# Patient Record
Sex: Female | Born: 1935 | Race: White | Hispanic: No | Marital: Married | Smoking: Never smoker
Health system: Southern US, Community
[De-identification: ages and names within clinical notes are randomized; demographics above are authoritative.]

## PROBLEM LIST (undated history)

## (undated) DIAGNOSIS — G4700 Insomnia, unspecified: Secondary | ICD-10-CM

## (undated) DIAGNOSIS — M8580 Other specified disorders of bone density and structure, unspecified site: Secondary | ICD-10-CM

## (undated) DIAGNOSIS — G2581 Restless legs syndrome: Secondary | ICD-10-CM

## (undated) DIAGNOSIS — Z8679 Personal history of other diseases of the circulatory system: Secondary | ICD-10-CM

## (undated) DIAGNOSIS — K5792 Diverticulitis of intestine, part unspecified, without perforation or abscess without bleeding: Secondary | ICD-10-CM

## (undated) DIAGNOSIS — Z8489 Family history of other specified conditions: Secondary | ICD-10-CM

## (undated) DIAGNOSIS — M1990 Unspecified osteoarthritis, unspecified site: Secondary | ICD-10-CM

## (undated) DIAGNOSIS — K219 Gastro-esophageal reflux disease without esophagitis: Secondary | ICD-10-CM

## (undated) DIAGNOSIS — E039 Hypothyroidism, unspecified: Secondary | ICD-10-CM

## (undated) DIAGNOSIS — C539 Malignant neoplasm of cervix uteri, unspecified: Secondary | ICD-10-CM

## (undated) DIAGNOSIS — R634 Abnormal weight loss: Secondary | ICD-10-CM

## (undated) DIAGNOSIS — K921 Melena: Secondary | ICD-10-CM

## (undated) DIAGNOSIS — K644 Residual hemorrhoidal skin tags: Secondary | ICD-10-CM

## (undated) DIAGNOSIS — E785 Hyperlipidemia, unspecified: Secondary | ICD-10-CM

## (undated) DIAGNOSIS — G629 Polyneuropathy, unspecified: Secondary | ICD-10-CM

## (undated) DIAGNOSIS — N342 Other urethritis: Secondary | ICD-10-CM

## (undated) DIAGNOSIS — K5790 Diverticulosis of intestine, part unspecified, without perforation or abscess without bleeding: Secondary | ICD-10-CM

## (undated) DIAGNOSIS — E079 Disorder of thyroid, unspecified: Secondary | ICD-10-CM

## (undated) DIAGNOSIS — F32A Depression, unspecified: Secondary | ICD-10-CM

## (undated) DIAGNOSIS — R1310 Dysphagia, unspecified: Secondary | ICD-10-CM

## (undated) DIAGNOSIS — D649 Anemia, unspecified: Secondary | ICD-10-CM

## (undated) DIAGNOSIS — K449 Diaphragmatic hernia without obstruction or gangrene: Secondary | ICD-10-CM

## (undated) DIAGNOSIS — F419 Anxiety disorder, unspecified: Secondary | ICD-10-CM

## (undated) DIAGNOSIS — F329 Major depressive disorder, single episode, unspecified: Secondary | ICD-10-CM

## (undated) HISTORY — DX: Major depressive disorder, single episode, unspecified: F32.9

## (undated) HISTORY — DX: Family history of other specified conditions: Z84.89

## (undated) HISTORY — PX: ERCP: SHX60

## (undated) HISTORY — DX: Restless legs syndrome: G25.81

## (undated) HISTORY — DX: Melena: K92.1

## (undated) HISTORY — PX: CATARACT EXTRACTION: SUR2

## (undated) HISTORY — DX: Abnormal weight loss: R63.4

## (undated) HISTORY — PX: BREAST LUMPECTOMY: SHX2

## (undated) HISTORY — DX: Depression, unspecified: F32.A

## (undated) HISTORY — DX: Other urethritis: N34.2

## (undated) HISTORY — DX: Diverticulitis of intestine, part unspecified, without perforation or abscess without bleeding: K57.92

## (undated) HISTORY — PX: HEMORRHOID SURGERY: SHX153

## (undated) HISTORY — DX: Disorder of thyroid, unspecified: E07.9

## (undated) HISTORY — DX: Anxiety disorder, unspecified: F41.9

## (undated) HISTORY — DX: Diverticulosis of intestine, part unspecified, without perforation or abscess without bleeding: K57.90

## (undated) HISTORY — DX: Hypothyroidism, unspecified: E03.9

## (undated) HISTORY — DX: Hyperlipidemia, unspecified: E78.5

## (undated) HISTORY — DX: Anemia, unspecified: D64.9

## (undated) HISTORY — DX: Residual hemorrhoidal skin tags: K64.4

## (undated) HISTORY — DX: Polyneuropathy, unspecified: G62.9

## (undated) HISTORY — DX: Malignant neoplasm of cervix uteri, unspecified: C53.9

## (undated) HISTORY — DX: Diaphragmatic hernia without obstruction or gangrene: K44.9

## (undated) HISTORY — DX: Personal history of other diseases of the circulatory system: Z86.79

## (undated) HISTORY — DX: Insomnia, unspecified: G47.00

## (undated) HISTORY — DX: Other specified disorders of bone density and structure, unspecified site: M85.80

## (undated) HISTORY — DX: Unspecified osteoarthritis, unspecified site: M19.90

## (undated) HISTORY — PX: HAND SURGERY: SHX662

## (undated) HISTORY — PX: COLONOSCOPY: SHX174

## (undated) HISTORY — DX: Gastro-esophageal reflux disease without esophagitis: K21.9

---

## 1958-12-16 DIAGNOSIS — C539 Malignant neoplasm of cervix uteri, unspecified: Secondary | ICD-10-CM

## 1958-12-16 HISTORY — PX: ABDOMINAL HYSTERECTOMY: SHX81

## 1958-12-16 HISTORY — DX: Malignant neoplasm of cervix uteri, unspecified: C53.9

## 1998-03-28 ENCOUNTER — Other Ambulatory Visit: Admission: RE | Admit: 1998-03-28 | Discharge: 1998-03-28 | Payer: Self-pay | Admitting: *Deleted

## 1999-02-07 ENCOUNTER — Ambulatory Visit (HOSPITAL_COMMUNITY): Admission: RE | Admit: 1999-02-07 | Discharge: 1999-02-07 | Payer: Self-pay | Admitting: Gastroenterology

## 1999-02-07 ENCOUNTER — Encounter: Payer: Self-pay | Admitting: Gastroenterology

## 1999-03-20 ENCOUNTER — Other Ambulatory Visit: Admission: RE | Admit: 1999-03-20 | Discharge: 1999-03-20 | Payer: Self-pay | Admitting: *Deleted

## 1999-04-11 ENCOUNTER — Emergency Department (HOSPITAL_COMMUNITY): Admission: EM | Admit: 1999-04-11 | Discharge: 1999-04-11 | Payer: Self-pay | Admitting: Emergency Medicine

## 2000-01-14 ENCOUNTER — Ambulatory Visit (HOSPITAL_COMMUNITY): Admission: RE | Admit: 2000-01-14 | Discharge: 2000-01-14 | Payer: Self-pay | Admitting: Gastroenterology

## 2000-01-14 ENCOUNTER — Encounter: Payer: Self-pay | Admitting: Gastroenterology

## 2000-01-22 ENCOUNTER — Inpatient Hospital Stay (HOSPITAL_COMMUNITY): Admission: EM | Admit: 2000-01-22 | Discharge: 2000-01-24 | Payer: Self-pay | Admitting: Emergency Medicine

## 2000-01-22 ENCOUNTER — Encounter: Payer: Self-pay | Admitting: Emergency Medicine

## 2000-05-30 ENCOUNTER — Other Ambulatory Visit: Admission: RE | Admit: 2000-05-30 | Discharge: 2000-05-30 | Payer: Self-pay | Admitting: *Deleted

## 2000-08-03 ENCOUNTER — Emergency Department (HOSPITAL_COMMUNITY): Admission: EM | Admit: 2000-08-03 | Discharge: 2000-08-03 | Payer: Self-pay | Admitting: Emergency Medicine

## 2000-08-03 ENCOUNTER — Encounter: Payer: Self-pay | Admitting: Emergency Medicine

## 2001-09-01 ENCOUNTER — Other Ambulatory Visit: Admission: RE | Admit: 2001-09-01 | Discharge: 2001-09-01 | Payer: Self-pay | Admitting: *Deleted

## 2002-05-08 ENCOUNTER — Emergency Department (HOSPITAL_COMMUNITY): Admission: EM | Admit: 2002-05-08 | Discharge: 2002-05-08 | Payer: Self-pay

## 2002-05-19 ENCOUNTER — Emergency Department (HOSPITAL_COMMUNITY): Admission: EM | Admit: 2002-05-19 | Discharge: 2002-05-19 | Payer: Self-pay | Admitting: Emergency Medicine

## 2002-07-05 ENCOUNTER — Encounter: Payer: Self-pay | Admitting: Gastroenterology

## 2002-07-05 ENCOUNTER — Ambulatory Visit (HOSPITAL_COMMUNITY): Admission: RE | Admit: 2002-07-05 | Discharge: 2002-07-05 | Payer: Self-pay | Admitting: Gastroenterology

## 2003-01-28 ENCOUNTER — Encounter: Payer: Self-pay | Admitting: Emergency Medicine

## 2003-01-28 ENCOUNTER — Inpatient Hospital Stay (HOSPITAL_COMMUNITY): Admission: EM | Admit: 2003-01-28 | Discharge: 2003-01-29 | Payer: Self-pay | Admitting: Emergency Medicine

## 2005-01-08 ENCOUNTER — Ambulatory Visit: Payer: Self-pay | Admitting: Family Medicine

## 2005-02-06 ENCOUNTER — Ambulatory Visit: Payer: Self-pay | Admitting: Family Medicine

## 2005-04-16 ENCOUNTER — Ambulatory Visit: Payer: Self-pay | Admitting: Family Medicine

## 2005-10-02 ENCOUNTER — Ambulatory Visit: Payer: Self-pay | Admitting: Gastroenterology

## 2005-10-16 ENCOUNTER — Ambulatory Visit: Payer: Self-pay | Admitting: Family Medicine

## 2005-12-04 ENCOUNTER — Ambulatory Visit: Payer: Self-pay | Admitting: Family Medicine

## 2005-12-19 ENCOUNTER — Ambulatory Visit: Payer: Self-pay | Admitting: Family Medicine

## 2006-03-11 ENCOUNTER — Ambulatory Visit: Payer: Self-pay | Admitting: Family Medicine

## 2006-07-10 ENCOUNTER — Ambulatory Visit: Payer: Self-pay | Admitting: Family Medicine

## 2006-08-07 ENCOUNTER — Ambulatory Visit: Payer: Self-pay | Admitting: Family Medicine

## 2006-09-18 ENCOUNTER — Ambulatory Visit: Payer: Self-pay | Admitting: Family Medicine

## 2006-11-20 ENCOUNTER — Ambulatory Visit: Payer: Self-pay | Admitting: Family Medicine

## 2007-01-27 ENCOUNTER — Ambulatory Visit: Payer: Self-pay | Admitting: Family Medicine

## 2007-05-26 ENCOUNTER — Ambulatory Visit: Payer: Self-pay | Admitting: Family Medicine

## 2007-05-26 LAB — CONVERTED CEMR LAB
ALT: 13 units/L (ref 0–40)
AST: 26 units/L (ref 0–37)
Albumin: 4 g/dL (ref 3.5–5.2)
Alkaline Phosphatase: 151 units/L — ABNORMAL HIGH (ref 39–117)
BUN: 17 mg/dL (ref 6–23)
Basophils Absolute: 0 10*3/uL (ref 0.0–0.1)
Basophils Relative: 0.6 % (ref 0.0–1.0)
Bilirubin, Direct: 0.1 mg/dL (ref 0.0–0.3)
CO2: 27 meq/L (ref 19–32)
Calcium: 9.5 mg/dL (ref 8.4–10.5)
Chloride: 103 meq/L (ref 96–112)
Cholesterol: 253 mg/dL (ref 0–200)
Creatinine, Ser: 0.9 mg/dL (ref 0.4–1.2)
Direct LDL: 153.9 mg/dL
Eosinophils Absolute: 0.1 10*3/uL (ref 0.0–0.6)
Eosinophils Relative: 1.2 % (ref 0.0–5.0)
GFR calc Af Amer: 76 mL/min
GFR calc non Af Amer: 63 mL/min
Glucose, Bld: 94 mg/dL (ref 70–99)
HCT: 40.2 % (ref 36.0–46.0)
HDL: 62.1 mg/dL (ref >39.0)
Hemoglobin: 13.9 g/dL (ref 12.0–15.0)
Lymphocytes Relative: 20.3 % (ref 12.0–46.0)
MCHC: 34.5 g/dL (ref 30.0–36.0)
MCV: 95.1 fL (ref 78.0–100.0)
Monocytes Absolute: 0.4 10*3/uL (ref 0.2–0.7)
Monocytes Relative: 5.4 % (ref 3.0–11.0)
Neutro Abs: 5.2 10*3/uL (ref 1.4–7.7)
Neutrophils Relative %: 72.5 % (ref 43.0–77.0)
Platelets: 260 10*3/uL (ref 150–400)
Potassium: 4 meq/L (ref 3.5–5.1)
RBC: 4.23 M/uL (ref 3.87–5.11)
RDW: 12.1 % (ref 11.5–14.6)
Sodium: 140 meq/L (ref 135–145)
TSH: 3.48 microintl units/mL (ref 0.35–5.50)
Total Bilirubin: 0.6 mg/dL (ref 0.3–1.2)
Total CHOL/HDL Ratio: 4.1
Total Protein: 7.4 g/dL (ref 6.0–8.3)
Triglycerides: 252 mg/dL (ref 0–149)
VLDL: 50 mg/dL — ABNORMAL HIGH (ref 0–40)
WBC: 7.2 10*3/uL (ref 4.5–10.5)

## 2007-06-10 ENCOUNTER — Encounter: Admission: RE | Admit: 2007-06-10 | Discharge: 2007-06-10 | Payer: Self-pay | Admitting: Family Medicine

## 2007-06-23 ENCOUNTER — Encounter: Payer: Self-pay | Admitting: Family Medicine

## 2007-07-14 ENCOUNTER — Encounter: Payer: Self-pay | Admitting: Family Medicine

## 2007-07-19 ENCOUNTER — Encounter: Admission: RE | Admit: 2007-07-19 | Discharge: 2007-07-19 | Payer: Self-pay | Admitting: Neurology

## 2007-07-23 ENCOUNTER — Encounter: Payer: Self-pay | Admitting: Family Medicine

## 2007-08-18 ENCOUNTER — Telehealth: Payer: Self-pay

## 2007-08-31 DIAGNOSIS — Z8719 Personal history of other diseases of the digestive system: Secondary | ICD-10-CM

## 2007-09-29 ENCOUNTER — Ambulatory Visit: Payer: Self-pay | Admitting: Family Medicine

## 2007-11-17 ENCOUNTER — Telehealth: Payer: Self-pay | Admitting: Family Medicine

## 2008-01-25 ENCOUNTER — Telehealth: Payer: Self-pay

## 2008-02-08 ENCOUNTER — Telehealth: Payer: Self-pay | Admitting: Family Medicine

## 2008-02-23 ENCOUNTER — Ambulatory Visit: Payer: Self-pay | Admitting: Family Medicine

## 2008-02-23 DIAGNOSIS — R519 Headache, unspecified: Secondary | ICD-10-CM | POA: Insufficient documentation

## 2008-02-23 DIAGNOSIS — E785 Hyperlipidemia, unspecified: Secondary | ICD-10-CM

## 2008-02-23 DIAGNOSIS — R42 Dizziness and giddiness: Secondary | ICD-10-CM

## 2008-02-23 DIAGNOSIS — E039 Hypothyroidism, unspecified: Secondary | ICD-10-CM | POA: Insufficient documentation

## 2008-02-23 DIAGNOSIS — R51 Headache: Secondary | ICD-10-CM

## 2008-02-24 ENCOUNTER — Telehealth: Payer: Self-pay | Admitting: Family Medicine

## 2008-02-25 ENCOUNTER — Encounter: Payer: Self-pay | Admitting: Family Medicine

## 2008-03-04 ENCOUNTER — Telehealth: Payer: Self-pay | Admitting: Family Medicine

## 2008-03-08 LAB — CONVERTED CEMR LAB
ALT: 12 units/L (ref 0–35)
AST: 24 units/L (ref 0–37)
Albumin: 4.1 g/dL (ref 3.5–5.2)
Alkaline Phosphatase: 114 units/L (ref 39–117)
Bilirubin, Direct: 0.1 mg/dL (ref 0.0–0.3)
Cholesterol: 224 mg/dL (ref 0–200)
Direct LDL: 130.5 mg/dL
HDL: 77.1 mg/dL (ref >39.0)
TSH: 2.74 microintl units/mL (ref 0.35–5.50)
Total Bilirubin: 0.6 mg/dL (ref 0.3–1.2)
Total CHOL/HDL Ratio: 2.9
Total Protein: 7.3 g/dL (ref 6.0–8.3)
Triglycerides: 169 mg/dL — ABNORMAL HIGH (ref 0–149)
VLDL: 34 mg/dL (ref 0–40)

## 2008-04-06 ENCOUNTER — Telehealth: Payer: Self-pay | Admitting: Family Medicine

## 2008-04-12 ENCOUNTER — Telehealth: Payer: Self-pay | Admitting: Family Medicine

## 2008-07-21 ENCOUNTER — Telehealth: Payer: Self-pay

## 2008-08-03 ENCOUNTER — Telehealth: Payer: Self-pay | Admitting: Family Medicine

## 2008-08-10 ENCOUNTER — Telehealth: Payer: Self-pay | Admitting: Family Medicine

## 2008-09-13 ENCOUNTER — Telehealth: Payer: Self-pay | Admitting: Family Medicine

## 2008-09-20 ENCOUNTER — Ambulatory Visit: Payer: Self-pay | Admitting: Family Medicine

## 2008-09-20 DIAGNOSIS — K219 Gastro-esophageal reflux disease without esophagitis: Secondary | ICD-10-CM

## 2008-09-20 DIAGNOSIS — I10 Essential (primary) hypertension: Secondary | ICD-10-CM

## 2008-10-13 ENCOUNTER — Telehealth: Payer: Self-pay | Admitting: Family Medicine

## 2008-10-19 ENCOUNTER — Ambulatory Visit: Payer: Self-pay | Admitting: Family Medicine

## 2008-11-29 ENCOUNTER — Ambulatory Visit: Payer: Self-pay | Admitting: Family Medicine

## 2008-12-06 ENCOUNTER — Ambulatory Visit: Payer: Self-pay | Admitting: Family Medicine

## 2008-12-11 LAB — CONVERTED CEMR LAB
BUN: 19 mg/dL (ref 6–23)
Basophils Absolute: 0 10*3/uL (ref 0.0–0.1)
Basophils Relative: 0.2 % (ref 0.0–3.0)
CO2: 28 meq/L (ref 19–32)
Calcium: 9.3 mg/dL (ref 8.4–10.5)
Chloride: 105 meq/L (ref 96–112)
Creatinine, Ser: 1 mg/dL (ref 0.4–1.2)
Eosinophils Absolute: 0 10*3/uL (ref 0.0–0.7)
Eosinophils Relative: 0.3 % (ref 0.0–5.0)
Folate: 6.9 ng/mL
GFR calc Af Amer: 67 mL/min
GFR calc non Af Amer: 55 mL/min
Glucose, Bld: 107 mg/dL — ABNORMAL HIGH (ref 70–99)
HCT: 32.9 % — ABNORMAL LOW (ref 36.0–46.0)
Hemoglobin: 10.7 g/dL — ABNORMAL LOW (ref 12.0–15.0)
Lymphocytes Relative: 17.9 % (ref 12.0–46.0)
MCHC: 32.6 g/dL (ref 30.0–36.0)
MCV: 77.1 fL — ABNORMAL LOW (ref 78.0–100.0)
Monocytes Absolute: 0.3 10*3/uL (ref 0.1–1.0)
Monocytes Relative: 3.2 % (ref 3.0–12.0)
Neutro Abs: 6.5 10*3/uL (ref 1.4–7.7)
Neutrophils Relative %: 78.4 % — ABNORMAL HIGH (ref 43.0–77.0)
Platelets: 262 10*3/uL (ref 150–400)
Potassium: 2.4 meq/L — CL (ref 3.5–5.1)
RBC: 4.27 M/uL (ref 3.87–5.11)
RDW: 18 % — ABNORMAL HIGH (ref 11.5–14.6)
Sodium: 145 meq/L (ref 135–145)
T3 Uptake Ratio: 41.1 % — ABNORMAL HIGH (ref 22.5–37.0)
T4, Total: 7.8 ug/dL (ref 5.0–12.5)
TSH: 0.09 microintl units/mL — ABNORMAL LOW (ref 0.35–5.50)
Vitamin B-12: 594 pg/mL (ref 211–911)
WBC: 8.3 10*3/uL (ref 4.5–10.5)

## 2008-12-12 ENCOUNTER — Telehealth: Payer: Self-pay | Admitting: Family Medicine

## 2008-12-20 ENCOUNTER — Telehealth: Payer: Self-pay | Admitting: Family Medicine

## 2009-02-01 ENCOUNTER — Telehealth: Payer: Self-pay | Admitting: Family Medicine

## 2009-02-02 ENCOUNTER — Telehealth: Payer: Self-pay | Admitting: Family Medicine

## 2009-02-09 ENCOUNTER — Telehealth: Payer: Self-pay

## 2009-02-22 ENCOUNTER — Telehealth: Payer: Self-pay | Admitting: Family Medicine

## 2009-03-15 ENCOUNTER — Telehealth: Payer: Self-pay | Admitting: Family Medicine

## 2009-03-28 ENCOUNTER — Telehealth: Payer: Self-pay | Admitting: Family Medicine

## 2009-04-25 ENCOUNTER — Telehealth: Payer: Self-pay | Admitting: Family Medicine

## 2009-05-02 ENCOUNTER — Encounter: Payer: Self-pay | Admitting: Family Medicine

## 2009-05-03 ENCOUNTER — Telehealth: Payer: Self-pay | Admitting: Family Medicine

## 2009-06-08 ENCOUNTER — Ambulatory Visit: Payer: Self-pay | Admitting: Family Medicine

## 2009-06-08 DIAGNOSIS — K644 Residual hemorrhoidal skin tags: Secondary | ICD-10-CM

## 2009-09-12 ENCOUNTER — Ambulatory Visit: Payer: Self-pay | Admitting: Family Medicine

## 2009-09-14 ENCOUNTER — Telehealth: Payer: Self-pay | Admitting: Family Medicine

## 2009-10-11 ENCOUNTER — Telehealth: Payer: Self-pay | Admitting: Family Medicine

## 2009-11-28 ENCOUNTER — Ambulatory Visit: Payer: Self-pay | Admitting: Family Medicine

## 2009-11-28 DIAGNOSIS — G609 Hereditary and idiopathic neuropathy, unspecified: Secondary | ICD-10-CM | POA: Insufficient documentation

## 2009-11-28 DIAGNOSIS — L259 Unspecified contact dermatitis, unspecified cause: Secondary | ICD-10-CM | POA: Insufficient documentation

## 2009-11-28 DIAGNOSIS — G4700 Insomnia, unspecified: Secondary | ICD-10-CM | POA: Insufficient documentation

## 2009-11-28 LAB — CONVERTED CEMR LAB
Bilirubin Urine: NEGATIVE
Blood in Urine, dipstick: NEGATIVE
Glucose, Urine, Semiquant: NEGATIVE
Nitrite: NEGATIVE
Specific Gravity, Urine: 1.02
Urobilinogen, UA: 0.2
pH: 6

## 2009-12-06 ENCOUNTER — Telehealth: Payer: Self-pay | Admitting: Family Medicine

## 2009-12-06 LAB — CONVERTED CEMR LAB
ALT: 10 units/L (ref 0–35)
BUN: 27 mg/dL — ABNORMAL HIGH (ref 6–23)
Basophils Relative: 0.1 % (ref 0.0–3.0)
Chloride: 104 meq/L (ref 96–112)
Direct LDL: 120 mg/dL
Eosinophils Relative: 1.1 % (ref 0.0–5.0)
HCT: 35.4 % — ABNORMAL LOW (ref 36.0–46.0)
Hemoglobin: 11.7 g/dL — ABNORMAL LOW (ref 12.0–15.0)
Lymphs Abs: 1.2 10*3/uL (ref 0.7–4.0)
MCV: 87.9 fL (ref 78.0–100.0)
Monocytes Absolute: 0.3 10*3/uL (ref 0.1–1.0)
Neutro Abs: 5.7 10*3/uL (ref 1.4–7.7)
Potassium: 3.6 meq/L (ref 3.5–5.1)
RBC: 4.02 M/uL (ref 3.87–5.11)
Sodium: 144 meq/L (ref 135–145)
TSH: 2.61 microintl units/mL (ref 0.35–5.50)
Total Protein: 7.7 g/dL (ref 6.0–8.3)
Triglycerides: 128 mg/dL (ref 0.0–149.0)
Vitamin B-12: 391 pg/mL (ref 211–911)
WBC: 7.3 10*3/uL (ref 4.5–10.5)

## 2010-01-03 ENCOUNTER — Telehealth: Payer: Self-pay | Admitting: Family Medicine

## 2010-02-27 ENCOUNTER — Telehealth: Payer: Self-pay | Admitting: Family Medicine

## 2010-05-10 ENCOUNTER — Telehealth: Payer: Self-pay | Admitting: Family Medicine

## 2010-05-30 ENCOUNTER — Encounter: Payer: Self-pay | Admitting: Family Medicine

## 2010-07-24 ENCOUNTER — Telehealth: Payer: Self-pay | Admitting: Family Medicine

## 2010-07-31 ENCOUNTER — Ambulatory Visit: Payer: Self-pay | Admitting: Family Medicine

## 2010-07-31 DIAGNOSIS — G2581 Restless legs syndrome: Secondary | ICD-10-CM

## 2010-07-31 DIAGNOSIS — B372 Candidiasis of skin and nail: Secondary | ICD-10-CM | POA: Insufficient documentation

## 2010-07-31 DIAGNOSIS — F341 Dysthymic disorder: Secondary | ICD-10-CM

## 2010-08-27 ENCOUNTER — Ambulatory Visit: Payer: Self-pay | Admitting: Family Medicine

## 2010-12-03 ENCOUNTER — Telehealth: Payer: Self-pay | Admitting: Family Medicine

## 2010-12-11 ENCOUNTER — Telehealth: Payer: Self-pay | Admitting: Family Medicine

## 2011-01-03 ENCOUNTER — Other Ambulatory Visit: Payer: Self-pay | Admitting: Family Medicine

## 2011-01-03 ENCOUNTER — Ambulatory Visit
Admission: RE | Admit: 2011-01-03 | Discharge: 2011-01-03 | Payer: Self-pay | Source: Home / Self Care | Attending: Family Medicine | Admitting: Family Medicine

## 2011-01-03 DIAGNOSIS — Z8679 Personal history of other diseases of the circulatory system: Secondary | ICD-10-CM | POA: Insufficient documentation

## 2011-01-03 DIAGNOSIS — D649 Anemia, unspecified: Secondary | ICD-10-CM | POA: Insufficient documentation

## 2011-01-03 DIAGNOSIS — J310 Chronic rhinitis: Secondary | ICD-10-CM | POA: Insufficient documentation

## 2011-01-03 LAB — BASIC METABOLIC PANEL
BUN: 22 mg/dL (ref 6–23)
CO2: 28 mEq/L (ref 19–32)
Calcium: 9.2 mg/dL (ref 8.4–10.5)
Chloride: 101 mEq/L (ref 96–112)
Creatinine, Ser: 0.8 mg/dL (ref 0.4–1.2)
GFR: 72.27 mL/min (ref >60.00)
Glucose, Bld: 95 mg/dL (ref 70–99)
Potassium: 4.1 mEq/L (ref 3.5–5.1)
Sodium: 137 mEq/L (ref 135–145)

## 2011-01-03 LAB — TSH: TSH: 3.18 u[IU]/mL (ref 0.35–5.50)

## 2011-01-03 LAB — CBC WITH DIFFERENTIAL/PLATELET
Basophils Absolute: 0 10*3/uL (ref 0.0–0.1)
Basophils Relative: 0.4 % (ref 0.0–3.0)
Eosinophils Absolute: 0.1 10*3/uL (ref 0.0–0.7)
Eosinophils Relative: 1.4 % (ref 0.0–5.0)
HCT: 35.1 % — ABNORMAL LOW (ref 36.0–46.0)
Hemoglobin: 11.8 g/dL — ABNORMAL LOW (ref 12.0–15.0)
Lymphocytes Relative: 18.2 % (ref 12.0–46.0)
Lymphs Abs: 1.4 10*3/uL (ref 0.7–4.0)
MCHC: 33.7 g/dL (ref 30.0–36.0)
MCV: 88.5 fl (ref 78.0–100.0)
Monocytes Absolute: 0.5 10*3/uL (ref 0.1–1.0)
Monocytes Relative: 6.8 % (ref 3.0–12.0)
Neutro Abs: 5.7 10*3/uL (ref 1.4–7.7)
Neutrophils Relative %: 73.2 % (ref 43.0–77.0)
Platelets: 245 10*3/uL (ref 150.0–400.0)
RBC: 3.97 Mil/uL (ref 3.87–5.11)
RDW: 15.6 % — ABNORMAL HIGH (ref 11.5–14.6)
WBC: 7.7 10*3/uL (ref 4.5–10.5)

## 2011-01-03 LAB — LIPID PANEL
Cholesterol: 220 mg/dL — ABNORMAL HIGH (ref 0–200)
HDL: 75.8 mg/dL (ref >39.00)
Total CHOL/HDL Ratio: 3
Triglycerides: 185 mg/dL — ABNORMAL HIGH (ref 0.0–149.0)
VLDL: 37 mg/dL (ref 0.0–40.0)

## 2011-01-03 LAB — HEPATIC FUNCTION PANEL
ALT: 11 U/L (ref 0–35)
AST: 25 U/L (ref 0–37)
Albumin: 3.9 g/dL (ref 3.5–5.2)
Alkaline Phosphatase: 130 U/L — ABNORMAL HIGH (ref 39–117)
Bilirubin, Direct: 0.1 mg/dL (ref 0.0–0.3)
Total Bilirubin: 0.5 mg/dL (ref 0.3–1.2)
Total Protein: 7.1 g/dL (ref 6.0–8.3)

## 2011-01-03 LAB — LDL CHOLESTEROL, DIRECT: Direct LDL: 119 mg/dL

## 2011-01-15 NOTE — Assessment & Plan Note (Signed)
Summary: flu shot/njr   Nurse Visit   Review of Systems       Flu Vaccine Consent Questions     Do you have a history of severe allergic reactions to this vaccine? no    Any prior history of allergic reactions to egg and/or gelatin? no    Do you have a sensitivity to the preservative Thimersol? no    Do you have a past history of Guillan-Barre Syndrome? no    Do you currently have an acute febrile illness? no    Have you ever had a severe reaction to latex? no    Vaccine information given and explained to patient? yes    Are you currently pregnant? no    Lot Number:[id number]   Exp Date:06/15/2011   Site Given  Left Deltoid IM Josph Macho RMA  August 27, 2010 3:12 PM    Allergies: 1)  Codeine Phosphate (Codeine Phosphate) 2)  Tetanus Toxoid Adsorbed (Tetanus Toxoid Adsorbed)  Orders Added: 1)  Flu Vaccine 5yrs + MEDICARE PATIENTS [Q2039] 2)  Administration Flu vaccine - MCR [G0008]

## 2011-01-15 NOTE — Progress Notes (Signed)
Summary: rx butabital   Phone Note Call from Patient   Caller: Spouse Summary of Call: refill butabital to [facility]  Initial call taken by: Pura Spice, RN,  May 10, 2010 2:44 PM  Follow-up for Phone Call        ok per dr Scotty Court done.  Follow-up by: Pura Spice, RN,  May 10, 2010 2:44 PM    Prescriptions: FIORICET 50-325-40 MG  TABS [facility]) take 2 tablets every 4-6 hrs when needed for headache.  No early refills NO MORE THAN 4 PER DAY.  #100 x 5   Entered by:   Pura Spice, RN   Authorized by:   Judithann Sheen MD   Signed by:   Lynann Beaver CMA on 05/10/2010   Method used:   Telephoned to ...       [facility]* (retail)       [address]       [place], [place]  [id number]       Ph: [phone]       Fax: [phone]   RxID:   [id number]

## 2011-01-15 NOTE — Progress Notes (Signed)
Summary: private call  Phone Note Call from Patient   Caller: Patient Call For: Judithann Sheen MD Summary of Call: Pt would like to talk to Dr. Scotty Court regarding personal issues. Anytime is fine at his convenience. [phone] Initial call taken by: Lynann Beaver CMA,  February 27, 2010 2:47 PM  Follow-up for Phone Call        Pt said that it is not neccessary for Dr. Scotty Court to call her. Please disregard the call.  Follow-up by: Lucy Antigua,  February 27, 2010 3:54 PM

## 2011-01-15 NOTE — Assessment & Plan Note (Signed)
Summary: RECTAL BLEEDING WITH CONSTIPATION AGAIN/PS   Vital Signs:  Patient profile:   77 year old female Weight:      114 pounds O2 Sat:      97 % Temp:     97.6 degrees F Pulse rate:   112 / minute Pulse rhythm:   regular BP sitting:   116 / 78  (left arm)  Vitals Entered By: Pura Spice, RN (July 31, 2010 1:15 PM) CC: rash under breast vaginal area.  dr Terri Piedra gave her rx diconide oint .    History of Present Illness: This 77 year old white married female who physically active over the nerve give him a does have multiple problems one of them one of her main complaints is her husband she always manages to discuss things he has done well over the years, always when he is in the office with Today she complains of a rash under her breast and around the vulva and perineum ispreutic and she is warm Dr. Angelita Ingles has prescribed a medication for which he says has not cured the problem Patient also complains that she occasionally has bright red blood and she has a past stated stool but is only present when cleansing herself She continues to have headaches and would like refill on Fioricet Blood pressure is well controlled 116/78 She is always very tablets and stress if she does not take alprazolam 1 mg q.i.d. She needs Temazepam for sleep  Curtis control with Prilosec Continues to have restless legs if she does not take Requip Depression is controlled with Zoloft and Cymbalta    Allergies: 1)  Codeine Phosphate (Codeine Phosphate) 2)  Tetanus Toxoid Adsorbed (Tetanus Toxoid Adsorbed)  Past History:  Past Medical History: Last updated: 08/31/2007 Arthritis Blood in Stool Allergies Thyroid Problem Diverticulitis, hx of  Past Surgical History: Last updated: 08/31/2007 Hysterectomy Breast Biopsy  Social History: Last updated: 08/31/2007 Retired Married Never Smoked Alcohol use-no Drug use-no Regular exercise-no  Risk Factors: Smoking Status: never  (08/31/2007)  Review of Systems      See HPI  The patient denies anorexia, fever, weight loss, weight gain, vision loss, decreased hearing, hoarseness, chest pain, syncope, dyspnea on exertion, peripheral edema, prolonged cough, headaches, hemoptysis, abdominal pain, melena, hematochezia, severe indigestion/heartburn, hematuria, incontinence, genital sores, muscle weakness, suspicious skin lesions, transient blindness, difficulty walking, depression, unusual weight change, abnormal bleeding, enlarged lymph nodes, angioedema, breast masses, and testicular masses.    Physical Exam  General:  Well-developed,well-nourished,in no acute distress; alert,appropriate and cooperative throughout examination Neck:  No deformities, masses, or tenderness noted. Breasts:  No mass, nodules, thickening, tenderness, bulging, retraction, inflamation, nipple discharge or skin changes noted.  erythematous rash under both breasts compatible with monilia Lungs:  Normal respiratory effort, chest expands symmetrically. Lungs are clear to auscultation, no crackles or wheezes. Heart:  Normal rate and regular rhythm. S1 and S2 normal without gallop, murmur, click, rub or other extra sounds. Abdomen:  Bowel sounds positive,abdomen soft and non-tender without masses, organomegaly or hernias noted. Rectal:  erythematous scaly rash.perianal Anoscopic examination negative Genitalia:  external examination of the perineum reveals erythematous scaly type rash around the Msk:  No deformity or scoliosis noted of thoracic or lumbar spine.   Skin:  monilial infection under breast and over the perineum Psych:  Oriented X3.   appears hyper and can't and talks incessantly   Impression & Recommendations:  Problem # 1:  CANDIDIASIS, SKIN (ICD-112.3) Assessment New  Problem # 2:  RESTLESS LEG SYNDROME (ICD-333.94) Assessment:  Improved    Requip 5 mg q.d.  Problem # 3:  ANXIETY DEPRESSION (ICD-300.4) Assessment:  Unchanged alprazolam 1 mg t.i.d. Zoloft 100 mg a.m. 200 mg h.s. Cymbalta 60 mg q.d.  Problem # 4:  INSOMNIA (ICD-780.52) Assessment: Unchanged  Her updated medication list for this problem includes:    Temazepam 30 Mg Caps (Temazepam) .Marland Kitchen... 1 by mouth hs as needed sleep  Problem # 5:  RESIDUAL HEMORRHOIDAL SKIN TAGS (ICD-455.9) Assessment: Unchanged  Problem # 6:  ESSENTIAL HYPERTENSION, BENIGN (ICD-401.1) Assessment: Improved  Her updated medication list for this problem includes:    Norvasc 5 Mg Tabs (Amlodipine besylate) ..... Once daily  Problem # 7:  GERD (ICD-530.81) Assessment: Improved  Her updated medication list for this problem includes:    Prilosec Otc 20 Mg Tbec (Omeprazole magnesium) .Marland Kitchen... 1 by mouth two times a day  Problem # 8:  HEADACHE (ICD-784.0) Assessment: Unchanged  Her updated medication list for this problem includes:    Fioricet 50-325-40 Mg Tabs (Butalbital-apap-caffeine) .Marland Kitchen... Take 2 tablets every 4-6 hrs when needed for headache.  no early refills no more than 4 per day.  Problem # 9:  DIZZINESS, CHRONIC (ICD-780.4) Assessment: Unchanged  Complete Medication List: 1)  Simvastatin 40 Mg Tabs (Simvastatin) .... Take one tablet at bedtime 2)  Fioricet 50-325-40 Mg Tabs (Butalbital-apap-caffeine) .... Take 2 tablets every 4-6 hrs when needed for headache.  no early refills no more than 4 per day. 3)  Norvasc 5 Mg Tabs (Amlodipine besylate) .... Once daily 4)  Valtrex 500 Mg Tabs (Valacyclovir hcl) 5)  Alprazolam 1 Mg Tabs (Alprazolam) .Marland Kitchen.. 1 qid for stress, not over 4 per day needs to be seen 6)  Prilosec Otc 20 Mg Tbec (Omeprazole magnesium) .Marland Kitchen.. 1 by mouth two times a day 7)  Lyrica 75 Mg Caps (Pregabalin) .... Take by mouth two times a day  after relief take one at bedtime 8)  Temazepam 30 Mg Caps (Temazepam) .Marland Kitchen.. 1 by mouth hs as needed sleep 9)  Hydromet 5-1.5 Mg/71ml Syrp (Hydrocodone-homatropine) .Marland Kitchen.. 1-2 tsp q4h as needed cough no over 2 tsp   per day 10)  Diltiazem Cream2%  .... Apply qid as needed 11)  Analpram-hc 1-1 % Crea (Hydrocortisone ace-pramoxine) .... Apply two times a day as needed for pain or itching 12)  Synthroid 50 Mcg Tabs (Levothyroxine sodium) .Marland Kitchen.. 1 by mouth once daily 13)  Tandem Plus 162-115.2-1 Mg Caps (Fefum-fepo-fa-b cmp-c-zn-mn-cu) .Marland Kitchen.. 1 by mouth two times a day 14)  Lotrisone 1-0.05 % Crea (Clotrimazole-betamethasone) .... Apply two times a day as needed itching or pain 15)  Requip 5 Mg Tabs (Ropinirole hcl) .Marland Kitchen.. 1 qd 16)  Zoloft 100 Mg Tabs (Sertraline hcl) .Marland Kitchen.. 1 am 2 hs 17)  Cymbalta 60 Mg Cpep (Duloxetine hcl) .Marland Kitchen.. 1 qd  Patient Instructions: 1)  fungal infection under breast and over the vaginal area 2)  Treatment Lotrisone b.i.d. 3)  Continue other medications as prescribed Prescriptions: TEMAZEPAM 30 MG CAPS (TEMAZEPAM) 1 by mouth hs as needed sleep  #30 x 5   Entered and Authorized by:   Judithann Sheen MD   Signed by:   Judithann Sheen MD on 07/31/2010   Method used:   Print then Give to Patient   RxID:   [id number] ALPRAZOLAM 1 MG  TABS (ALPRAZOLAM) 1 qid for stress, not over 4 per day NEEDS TO BE SEEN  #120 x 5   Entered and Authorized by:   Valarie Merino  Montez Hageman MD   Signed by:   Judithann Sheen MD on 07/31/2010   Method used:   Print then Give to Patient   RxID:   [id number] HYDROMET 5-1.5 MG/5ML SYRP (HYDROCODONE-HOMATROPINE) 1-2 tsp q4h as needed cough no over 2 tsp  per day  #240 cc x 2   Entered and Authorized by:   Judithann Sheen MD   Signed by:   Judithann Sheen MD on 07/31/2010   Method used:   Print then Give to Patient   RxID:   [id number] FIORICET 50-325-40 MG  TABS [facility]) take 2 tablets every 4-6 hrs when needed for headache.  No early refills NO MORE THAN 4 PER DAY.  #100 x 5   Entered and Authorized by:   Judithann Sheen MD   Signed by:   Judithann Sheen MD on 07/31/2010   Method used:   Print then  Give to Patient   RxID:   [id number] DILTIAZEM CREAM2% apply qid as needed  #30 gm x 4   Entered and Authorized by:   Judithann Sheen MD   Signed by:   Judithann Sheen MD on 07/31/2010   Method used:   Print then Give to Patient   RxID:   [id number]

## 2011-01-15 NOTE — Progress Notes (Signed)
Summary: REQ FOR MED  Phone Note Call from Patient   Caller: Spouse Reason for Call: Talk to Nurse, Talk to Doctor Summary of Call: Pts husband called and expressed concerns about reinstating the pts med (Cymbalta)...Marland Kitchenpt doesn't seem to be doing well w/o it.... Req that same be sent in to [facility]  Pts husband Homero Fellers) adv that he can be reached @  [phone] with any questions or concerns.   Initial call taken by: Debbra Riding,  January 03, 2010 8:26 AM  Follow-up for Phone Call        dr Scotty Court called and spoke with pt  Follow-up by: Pura Spice, RN,  January 03, 2010 1:31 PM

## 2011-01-15 NOTE — Progress Notes (Signed)
Summary: rectal bleeding  Phone Note Call from Patient Call back at [facility] Phone [phone]   Summary of Call: Rectal bleeding again with constipation.  Is off & on, but wants Dr. Satira Sark to check & advise.  Wants workin [employer], only SDAs available.  Can't come Wed because she has a hair appt, can't come today because she fell & she's resting her leg.  Gave Tues appt 8-16.  If Dr. Erenest Rasher agrees, okay.  Meanwhile she will double up on her Metamucil.   Initial call taken by: Rudy Jew, RN,  July 24, 2010 2:59 PM  Follow-up for Phone Call        ok we will see her tuesday is fine.  Follow-up by: Pura Spice, RN,  July 24, 2010 5:15 PM

## 2011-01-15 NOTE — Assessment & Plan Note (Signed)
Summary: COUGH/CCM/resch with patient/jls   Vital Signs:  Patient Profile:   77 Years Old Female Weight:      120 pounds Temp:     98.2 degrees F Pulse rate:   104 / minute BP sitting:   110 / 74  (left arm)  Vitals Entered By: Pura Spice, RN (September 20, 2008 1:21 PM)                 Chief Complaint:  cough c/o left breast tender wants flu shot.  History of Present Illness: Pt in with her husband who are verbally fighting through out the visit. Pt relates she coughs at night and wheezes, husband says she has sleep apnea at times but not often, does not want to have sleep study at this time. continuew to have stress headaches relieved with Fioricet [facility] , needs to continue alprazolam and cymbalta but to reduce cymbalta 30 mg once daily Blood pressure under controll, cont  sertraline and amlodipine complains of tenderness left breast or cchest, pt falls occasionally needs Flu and pneumonia injections no other complaints    Current Allergies: CODEINE PHOSPHATE (CODEINE PHOSPHATE) TETANUS TOXOID ADSORBED (TETANUS TOXOID ADSORBED)     Review of Systems      See HPI  General      Complains of sleep disorder and weakness.  ENT      Denies decreased hearing, difficulty swallowing, ear discharge, earache, hoarseness, nasal congestion, nosebleeds, postnasal drainage, ringing in ears, sinus pressure, and sore throat.  CV      Denies bluish discoloration of lips or nails, chest pain or discomfort, difficulty breathing at night, difficulty breathing while lying down, fainting, fatigue, leg cramps with exertion, lightheadness, near fainting, palpitations, shortness of breath with exertion, swelling of feet, swelling of hands, and weight gain.  Resp      Complains of cough.  GI      Complains of constipation.      gerd, on prilosec  GU      under care Dr. Vonita Moss for recurrant UTI  MS      Denies joint pain, joint redness, joint swelling, loss of  strength, low back pain, mid back pain, muscle aches, muscle , cramps, muscle weakness, stiffness, and thoracic pain.  Derm      Denies changes in color of skin, changes in nail beds, dryness, excessive perspiration, flushing, hair loss, insect bite(s), itching, lesion(s), poor wound healing, and rash.  Neuro      Complains of poor balance.   Physical Exam  General:     Well-developed,well-nourished,in no acute distress; alert,appropriate and cooperative throughout examination Head:     Normocephalic and atraumatic without obvious abnormalities. No apparent alopecia or balding. Eyes:     No corneal or conjunctival inflammation noted. EOMI. Perrla. Funduscopic exam benign, without hemorrhages, exudates or papilledema. Vision grossly normal. Ears:     External ear exam shows no significant lesions or deformities.  Otoscopic examination reveals clear canals, tympanic membranes are intact bilaterally without bulging, retraction, inflammation or discharge. Hearing is grossly normal bilaterally. Nose:     External nasal examination shows no deformity or inflammation. Nasal mucosa are pink and moist without lesions or exudates. Mouth:     Oral mucosa and oropharynx without lesions or exudates.  Teeth in good repair. Chest Wall:     tenderness left chest wall Breasts:     No mass, nodules, thickening, tenderness, bulging, retraction, inflamation, nipple discharge or skin changes noted.   Lungs:  Normal respiratory effort, chest expands symmetrically. Lungs are clear to auscultation, no crackles or wheezes. Heart:     Normal rate and regular rhythm. S1 and S2 normal without gallop, murmur, click, rub or other extra sounds. Abdomen:     Bowel sounds positive,abdomen soft and non-tender without masses, organomegaly or hernias noted. Extremities:     No clubbing, cyanosis, edema, or deformity noted with normal full range of motion of all joints.   Neurologic:     unsteady gait, needs  cane Skin:     Intact without suspicious lesions or rashes Psych:     moderately anxious, poor concentration, and agitated.      Impression & Recommendations:  Problem # 1:  CONTUSION, CHEST WALL (ICD-922.1) Assessment: New    Flu Vaccine Consent Questions     Do you have a history of severe allergic reactions to this vaccine? no    Any prior history of allergic reactions to egg and/or gelatin? no    Do you have a sensitivity to the preservative Thimersol? no    Do you have a past history of Guillan-Barre Syndrome? no    Do you currently have an acute febrile illness? no    Have you ever had a severe reaction to latex? no    Vaccine information given and explained to patient? yes    Are you currently pregnant? no    Lot Number:[id number]   Site Given  Left Deltoid IM........Marland KitchenPura Spice, RN  September 20, 2008 1:23 PM    Problem # 2:  GERD (ICD-530.81) Assessment: Deteriorated  Her updated medication list for this problem includes:    Prilosec Otc 20 Mg Tbec (Omeprazole magnesium) .Marland Kitchen... 1 by mouth two times a day   Problem # 3:  HOARSENESS (WUJ-811.91) Assessment: New  Problem # 4:  APNEA (ICD-786.03) Assessment: New  Problem # 5:  HEADACHE (ICD-784.0) Assessment: Unchanged  Her updated medication list for this problem includes:    Fioricet 50-325-40 Mg Tabs (Butalbital-apap-caffeine) .Marland Kitchen... Take 2 tablets every 4-6 hrs when needed for headache.  no early refills no more than 4 per day.   Problem # 6:  HYPERLIPIDEMIA (ICD-272.4) Assessment: Unchanged  Her updated medication list for this problem includes:    Simvastatin 40 Mg Tabs (Simvastatin) .Marland Kitchen... Take one tablet at bedtime   Problem # 7:  HYPOTHYROIDISM (ICD-244.9) Assessment: Unchanged  Her updated medication list for this problem includes:    Synthroid 75 Mcg Tabs (Levothyroxine sodium) .Marland Kitchen... 1 once daily   Problem # 8:  ESSENTIAL HYPERTENSION, BENIGN (ICD-401.1) Assessment: Improved  Her updated  medication list for this problem includes:    Norvasc 5 Mg Tabs (Amlodipine besylate) ..... Once daily   Problem # 9:  ANXIETY DEPRESSION (ICD-300.4) Assessment: Unchanged  Complete Medication List: 1)  Sertraline Hcl 100 Mg Tabs (Sertraline hcl) .... Take 1 1/2 in the am and 1 1/2 at bedtime 2)  Simvastatin 40 Mg Tabs (Simvastatin) .... Take one tablet at bedtime 3)  Fioricet 50-325-40 Mg Tabs (Butalbital-apap-caffeine) .... Take 2 tablets every 4-6 hrs when needed for headache.  no early refills no more than 4 per day. 4)  Cymbalta 60 Mg Cpep (Duloxetine hcl) .Marland Kitchen.. 1 once daily 5)  Synthroid 75 Mcg Tabs (Levothyroxine sodium) .Marland Kitchen.. 1 once daily 6)  Norvasc 5 Mg Tabs (Amlodipine besylate) .... Once daily 7)  Valtrex 500 Mg Tabs (Valacyclovir hcl) 8)  Alprazolam 1 Mg Tabs (Alprazolam) .... Three times a day as needed stress. not over  3 tabs a day 9)  Requip 0.5 Mg Tabs (Ropinirole hcl) .Marland Kitchen.. 1 once daily 5 days then 2 qd 10)  Prilosec Otc 20 Mg Tbec (Omeprazole magnesium) .Marland Kitchen.. 1 by mouth two times a day 11)  Lyrica 75 Mg Caps (Pregabalin) .... Take by mouth two times a day  after relief take one at bedtime 12)  Temazepam 30 Mg Caps (Temazepam) .Marland Kitchen.. 1 by mouth hs as needed sleep 13)  Hydromet 5-1.5 Mg/27ml Syrp (Hydrocodone-homatropine) .Marland Kitchen.. 1-2 tsp q4h as needed cough no over 2 tsp  per day 14)  Lotrisone 1-0.05 % Lotn (Clotrimazole-betamethasone) .... Apply bid 15)  Diltiazem Cream  .... Apply qid as needed  Other Orders: Flu Vaccine 38yrs + (16109) Administration Flu vaccine (U0454) Pneumococcal Vaccine (09811) Admin 1st Vaccine (91478) Admin of Any Addtl Vaccine (29562)   Patient Instructions: 1)  No respiratory findings to explain cough, most likely Gerd 2)  To increase prilosec two times a day 3)  needs sleep study but you desire to wait 4)  continue regular medications   Prescriptions: LOTRISONE 1-0.05 % LOTN (CLOTRIMAZOLE-BETAMETHASONE) apply bid  #45 gms x 11   Entered and  Authorized by:   Judithann Sheen MD   Signed by:   Judithann Sheen MD on 09/20/2008   Method used:   Electronically to        [facility]* (retail)       [address]       [place], [place]  [address]       Ph: [phone]       Fax: [phone]   RxID:   [id number] DILTIAZEM CREAM apply qid as needed  #30 gms x 11   Entered and Authorized by:   Judithann Sheen MD   Signed by:   Judithann Sheen MD on 09/20/2008   Method used:   Print then Give to Patient   RxID:   [id number] LOTRISONE 1-0.05 % LOTN (CLOTRIMAZOLE-BETAMETHASONE) apply bid  #45 gms x 11   Entered and Authorized by:   Judithann Sheen MD   Signed by:   Judithann Sheen MD on 09/20/2008   Method used:   Electronically to        [facility]* (retail)       [address]       [place], [place]  [address]       Ph: [phone]       Fax: [phone]   RxID:   [id number] ALPRAZOLAM 1 MG  TABS (ALPRAZOLAM) three times a day as needed stress. not over 3 tabs a day  #90 x 5   Entered and Authorized by:   Judithann Sheen MD   Signed by:   Judithann Sheen MD on 09/20/2008   Method used:   Print then Give to Patient   RxID:   [id number] ALPRAZOLAM 1 MG  TABS (ALPRAZOLAM) three times a day as needed stress. not over 3 tabs a day  #90 x 5   Entered and Authorized by:   Judithann Sheen MD   Signed by:   Judithann Sheen MD on 09/20/2008   Method used:   Print then Give to Patient   RxID:   [id number] HYDROMET 5-1.5 MG/5ML SYRP (HYDROCODONE-HOMATROPINE) 1-2 tsp q4h as needed cough no over 2 tsp  per day  #240cc x 5   Entered and Authorized by:   Loni Dolly  Alphonzo Severance MD   Signed by:   Judithann Sheen MD on 09/20/2008   Method used:   Print then Give to Patient   RxID:   [id number] PRILOSEC OTC 20 MG  TBEC (OMEPRAZOLE MAGNESIUM) 1 by mouth two times a day  #60 x 11   Entered and Authorized by:   Judithann Sheen MD   Signed by:   Judithann Sheen MD on 09/20/2008   Method used:   Electronically to        [facility]* (retail)       [address]       [place], [place]  [address]       Ph: [phone]       Fax: [phone]   RxID:   [id number] REQUIP 0.5 MG  TABS (ROPINIROLE HCL) 1 once daily 5 days then 2 qd  #60 x 11   Entered and Authorized by:   Judithann Sheen MD   Signed by:   Judithann Sheen MD on 09/20/2008   Method used:   Electronically to        [facility]* (retail)       [address]       [place], [place]  [address]       Ph: [phone]       Fax: [phone]   RxID:   [id number] NORVASC 5 MG  TABS (AMLODIPINE BESYLATE) once daily  #30 x 11   Entered and Authorized by:   Judithann Sheen MD   Signed by:   Judithann Sheen MD on 09/20/2008   Method used:   Electronically to        [facility]* (retail)       [address]       [place], [place]  [address]       Ph: [phone]       Fax: [phone]   RxID:   [id number] SYNTHROID 75 MCG  TABS (LEVOTHYROXINE SODIUM) 1 once daily Brand medically necessary #30 x 11   Entered and Authorized by:   Judithann Sheen MD   Signed by:   Judithann Sheen MD on 09/20/2008   Method used:   Electronically to        [facility]* (retail)       [address]       [place], [place]  [address]       Ph: [phone]       Fax: [phone]   RxID:   [id number] CYMBALTA 60 MG  CPEP (DULOXETINE HCL) 1 once daily  #30 x 11   Entered and Authorized by:   Judithann Sheen MD   Signed by:   Judithann Sheen MD on 09/20/2008   Method used:   Electronically to        [facility]* (retail)       [address]       [place], [place]  [address]       Ph: [phone]       Fax: [phone]   RxID:   [id number] FIORICET 50-325-40 MG  TABS [facility]) take 2 tablets every  4-6 hrs when needed for headache.  No early refills NO MORE THAN 4 PER DAY.  #100 x 5   Entered and Authorized by:   Judithann Sheen MD   Signed by:  Judithann Sheen MD on 09/20/2008   Method used:   Electronically to        [facility]* (retail)       [address]       [place], [place]  [address]       Ph: [phone]       Fax: [phone]   RxID:   [id number] SIMVASTATIN 40 MG  TABS (SIMVASTATIN) TAKE ONE TABLET AT BEDTIME  #30 x 11   Entered and Authorized by:   Judithann Sheen MD   Signed by:   Judithann Sheen MD on 09/20/2008   Method used:   Electronically to        [facility]* (retail)       [address]       [place], [place]  [address]       Ph: [phone]       Fax: [phone]   RxID:   [id number] SERTRALINE HCL 100 MG  TABS (SERTRALINE HCL) take 1 1/2 in the am and 1 1/2 at bedtime  #90 x 11   Entered and Authorized by:   Judithann Sheen MD   Signed by:   Judithann Sheen MD on 09/20/2008   Method used:   Electronically to        [facility]* (retail)       [address]       [place], [place]  [address]       Ph: [phone]       Fax: [phone]   RxID:   [id number] HYDROMET 5-1.5 MG/5ML SYRP (HYDROCODONE-HOMATROPINE) 1-2 tsp q4h as needed cough no over 2 tsp  per day  #240 cc x 5   Entered and Authorized by:   Judithann Sheen MD   Signed by:   Judithann Sheen MD on 09/20/2008   Method used:   Print then Give to Patient   RxID:   [id number]  ]  Pneumovax Vaccine    Vaccine Type: Pneumovax    Site: right deltoid    Mfr: Merck    Dose: 0.5 ml    Route: IM    Given by: Pura Spice, RN    Exp. Date: 07/07/2009    Lot #: [id number]

## 2011-01-17 NOTE — Progress Notes (Signed)
Summary: Pt called and has a personal problem she needs to discuss  Phone Note Call from Patient Call back at [facility] Phone [phone]   Caller: Patient Summary of Call: Pt called and said that she really needs to talk re: problems with rectum. Needs Dr. Scotty Court to call asap. Pls call.  Initial call taken by: Lucy Antigua,  December 03, 2010 1:35 PM  Follow-up for Phone Call        Call needs to be triaged  Follow-up by: Trixie Dredge,  December 06, 2010 3:52 PM  Additional Follow-up for Phone Call Additional follow up Details #1::        After speaking to pt, she beleives she has PAD....Marland Kitchenpainful legs and has read Prozac will help this. Insists she speak to Dr. Scotty Court.  Does not want to see anyone else. Additional Follow-up by: Lynann Beaver CMA AAMA,  December 07, 2010 8:22 AM    Additional Follow-up for Phone Call Additional follow up Details #2::    Will call pt

## 2011-01-17 NOTE — Assessment & Plan Note (Signed)
Summary: FU PER HUS/NJR   Vital Signs:  Patient profile:   77 year old female Weight:      109 pounds O2 Sat:      96 % on Room air Temp:     98.2 degrees F oral Pulse rate:   122 / minute Pulse rhythm:   regular BP sitting:   150 / 90  (right arm) Cuff size:   regular  Vitals Entered By: Romualdo Bolk, CMA (AAMA) (January 03, 2011 2:49 PM)  O2 Flow:  Room air CC: Follow-up visit on meds. Pt states that she fell out of bed on tues and now has a knot on her head   History of Present Illness: bp 130/80 WHEN REPEATED  fell out of bed or fell when got out of bed, suspected orthostatic BP Uses diazepam 2 % cream  two times a day for anal inflammation needs refill medication complaining of nasal congestion Blood pressure 130/70 sitting and 120/670 standing and patient was not dizzy at this time Her lites has taken urinalysis to Dr. Shiela Mayer in the office but has not heard a report Please refill of medication  Current Medications (verified): 1)  Simvastatin 40 Mg  Tabs (Simvastatin) .... Take One Tablet At Bedtime 2)  Fioricet 50-325-40 Mg  Tabs (Butalbital-Apap-Caffeine) .... Take 2 Tablets Every 4-6 Hrs When Needed For Headache.  No Early Refills No More Than 4 Per Day. 3)  Norvasc 5 Mg  Tabs (Amlodipine Besylate) .... Once Daily 4)  Valtrex 500 Mg  Tabs (Valacyclovir Hcl) 5)  Alprazolam 1 Mg  Tabs (Alprazolam) .Marland Kitchen.. 1 Qid For Stress, Not Over 4 Per Day Needs To Be Seen 6)  Prilosec Otc 20 Mg  Tbec (Omeprazole Magnesium) .Marland Kitchen.. 1 By Mouth Two Times A Day 7)  Temazepam 30 Mg Caps (Temazepam) .Marland Kitchen.. 1 By Mouth Hs As Needed Sleep 8)  Hydromet 5-1.5 Mg/19ml Syrp (Hydrocodone-Homatropine) .Marland Kitchen.. 1-2 Tsp Q4h As Needed Cough No Over 2 Tsp  Per Day 9)  Diltiazem Cream2% .... Apply Qid As Needed 10)  Analpram-Hc 1-1 % Crea (Hydrocortisone Ace-Pramoxine) .... Apply Two Times A Day As Needed For Pain or Itching 11)  Synthroid 50 Mcg Tabs (Levothyroxine Sodium) .Marland Kitchen.. 1 By Mouth Once Daily 12)   Tandem Plus 162-115.2-1 Mg Caps (Fefum-Fepo-Fa-B Cmp-C-Zn-Mn-Cu) .Marland Kitchen.. 1 By Mouth Two Times A Day 13)  Lotrisone 1-0.05 % Crea (Clotrimazole-Betamethasone) .... Apply Two Times A Day As Needed Itching or Pain 14)  Requip 5 Mg Tabs (Ropinirole Hcl) .Marland Kitchen.. 1 Qd 15)  Zoloft 100 Mg Tabs (Sertraline Hcl) .Marland Kitchen.. 1 Am 2 Hs 16)  Mirapex 0.125 Mg Tabs (Pramipexole Dihydrochloride) .... For Restless Legs, 1 Once Daily 2-3 Hrs Before Bedtime, 2 Once Daily For 7 Days, Then 4 Once Daily For 14 Days, Call Md Before Refilling  Allergies (verified): 1)  Codeine Phosphate (Codeine Phosphate) 2)  Tetanus Toxoid Adsorbed (Tetanus Toxoid Adsorbed)  Past History:  Past Medical History: Last updated: 08/31/2007 Arthritis Blood in Stool Allergies Thyroid Problem Diverticulitis, hx of  Past Surgical History: Last updated: 08/31/2007 Hysterectomy Breast Biopsy  Family History: Last updated: 08/31/2007 Family History of Colon CA 1st degree relative <60  Social History: Last updated: 08/31/2007 Retired Married Never Smoked Alcohol use-no Drug use-no Regular exercise-no  Risk Factors: Smoking Status: never (08/31/2007)  Review of Systems      See HPI  Physical Exam  General:  Well-developed,well-nourished,in no acute distress; alert,appropriate and cooperative throughout examination Head:  Normocephalic and atraumatic without obvious abnormalities. No  apparent alopecia or balding. Eyes:  No corneal or conjunctival inflammation noted. EOMI. Perrla. Funduscopic exam benign, without hemorrhages, exudates or papilledema. Vision grossly normal. Ears:  External ear exam shows no significant lesions or deformities.  Otoscopic examination reveals clear canals, tympanic membranes are intact bilaterally without bulging, retraction, inflammation or discharge. Hearing is grossly normal bilaterally. Nose:  he is congested and boggy pale nasal mucosa with clear drainage Mouth:  Oral mucosa and oropharynx without  lesions or exudates.  Teeth in good repair. Neck:  No deformities, masses, or tenderness noted. Lungs:  Normal respiratory effort, chest expands symmetrically. Lungs are clear to auscultation, no crackles or wheezes. Heart:  Normal rate and regular rhythm. S1 and S2 normal without gallop, murmur, click, rub or other extra sounds. Msk:  No deformity or scoliosis noted of thoracic or lumbar spine.   Pulses:  R and L carotid,radial,femoral,dorsalis pedis and posterior tibial pulses are full and equal bilaterally Extremities:  No clubbing, cyanosis, edema, or deformity noted with normal full range of motion of all joints.   Neurologic:  No cranial nerve deficits noted. Station and gait are normal. Plantar reflexes are down-going bilaterally. DTRs are symmetrical throughout. Sensory, motor and coordinative functions appear intact.   Impression & Recommendations:  Problem # 1:  HYPOTENSION, ORTHOSTATIC, HX OF (ICD-V12.50) Assessment New instructed to be cautious changing position from lying to sitting to standing  Problem # 2:  RHINITIS (ICD-472.0) Assessment: New OTC decongestant as phenylephrine  Problem # 3:  ANEMIA (ICD-285.9) Assessment: Deteriorated  Her updated medication list for this problem includes:    Tandem Plus 162-115.2-1 Mg Caps (Fefum-fepo-fa-b cmp-c-zn-mn-cu) .Marland Kitchen... 1 by mouth two times a day  Orders: Specimen Handling (60454) TLB-CBC Platelet - w/Differential (85025-CBCD)  Problem # 4:  RESTLESS LEG SYNDROME (ICD-333.94) Assessment: Unchanged  Orders: Prescription Created Electronically [id number])  Problem # 5:  ANXIETY DEPRESSION (ICD-300.4) Assessment: Unchanged  Problem # 6:  INSOMNIA (ICD-780.52) Assessment: Unchanged  Her updated medication list for this problem includes:    Temazepam 30 Mg Caps (Temazepam) .Marland Kitchen... 1 by mouth hs as needed sleep  Problem # 7:  RESIDUAL HEMORRHOIDAL SKIN TAGS (ICD-455.9) Assessment: Unchanged  Problem # 8:  ESSENTIAL  HYPERTENSION, BENIGN (ICD-401.1) Assessment: Unchanged  Her updated medication list for this problem includes:    Norvasc 5 Mg Tabs (Amlodipine besylate) ..... Once daily  Orders: TLB-BMP (Basic Metabolic Panel-BMET) (80048-METABOL)  Problem # 9:  GERD (ICD-530.81) Assessment: Improved  Her updated medication list for this problem includes:    Prilosec Otc 20 Mg Tbec (Omeprazole magnesium) .Marland Kitchen... 1 by mouth two times a day  Problem # 10:  DIZZINESS, CHRONIC (ICD-780.4) Assessment: Unchanged  Complete Medication List: 1)  Simvastatin 40 Mg Tabs (Simvastatin) .... Take one tablet at bedtime 2)  Fioricet 50-325-40 Mg Tabs (Butalbital-apap-caffeine) .... Take 2 tablets every 4-6 hrs when needed for headache.  no early refills no more than 4 per day. 3)  Norvasc 5 Mg Tabs (Amlodipine besylate) .... Once daily 4)  Valtrex 500 Mg Tabs (Valacyclovir hcl) 5)  Alprazolam 1 Mg Tabs (Alprazolam) .Marland Kitchen.. 1 qid for stress, not over 4 per day needs to be seen 6)  Prilosec Otc 20 Mg Tbec (Omeprazole magnesium) .Marland Kitchen.. 1 by mouth two times a day 7)  Temazepam 30 Mg Caps (Temazepam) .Marland Kitchen.. 1 by mouth hs as needed sleep 8)  Hydromet 5-1.5 Mg/44ml Syrp (Hydrocodone-homatropine) .Marland Kitchen.. 1-2 tsp q4h as needed cough no over 2 tsp  per day 9)  Diltiazem Cream2%  .Marland KitchenMarland KitchenMarland Kitchen  Apply qid as needed 10)  Analpram-hc 1-1 % Crea (Hydrocortisone ace-pramoxine) .... Apply two times a day as needed for pain or itching 11)  Synthroid 50 Mcg Tabs (Levothyroxine sodium) .Marland Kitchen.. 1 by mouth once daily 12)  Tandem Plus 162-115.2-1 Mg Caps (Fefum-fepo-fa-b cmp-c-zn-mn-cu) .Marland Kitchen.. 1 by mouth two times a day 13)  Lotrisone 1-0.05 % Crea (Clotrimazole-betamethasone) .... Apply two times a day as needed itching or pain 14)  Zoloft 100 Mg Tabs (Sertraline hcl) .Marland Kitchen.. 1 am 2 hs 15)  Mirapex 0.125 Mg Tabs (Pramipexole dihydrochloride) .... For restless legs, 1 once daily 2-3 hrs before bedtime, 2 once daily for 7 days, then 4 once daily for 14 days, call md  before refilling  Other Orders: Venipuncture (16109) TLB-Lipid Panel (80061-LIPID) TLB-Hepatic/Liver Function Pnl (80076-HEPATIC) TLB-TSH (Thyroid Stimulating Hormone) (84443-TSH)  Patient Instructions: 1)  patient started to be cautious in changing position from a sitting lying to a standing position 2)  Refill medications 3)  Over-the-counter decongestant for the rhinitis Prescriptions: HYDROMET 5-1.5 MG/5ML SYRP (HYDROCODONE-HOMATROPINE) 1-2 tsp q4h as needed cough no over 2 tsp  per day  #230 x 1   Entered and Authorized by:   Judithann Sheen MD   Signed by:   Judithann Sheen MD on 01/03/2011   Method used:   Print then Give to Patient   RxID:   [id number] TEMAZEPAM 30 MG CAPS (TEMAZEPAM) 1 by mouth hs as needed sleep  #30 x 5   Entered and Authorized by:   Judithann Sheen MD   Signed by:   Judithann Sheen MD on 01/03/2011   Method used:   Print then Give to Patient   RxID:   [id number] FIORICET 50-325-40 MG  TABS [facility]) take 2 tablets every 4-6 hrs when needed for headache.  No early refills NO MORE THAN 4 PER DAY.  #100 x 5   Entered and Authorized by:   Judithann Sheen MD   Signed by:   Judithann Sheen MD on 01/03/2011   Method used:   Print then Give to Patient   RxID:   [id number] MIRAPEX 0.125 MG TABS (PRAMIPEXOLE DIHYDROCHLORIDE) for restless legs, 1 once daily 2-3 hrs before bedtime, 2 once daily for 7 days, then 4 once daily for 14 days, call MD before refilling  #30 x 11   Entered and Authorized by:   Judithann Sheen MD   Signed by:   Judithann Sheen MD on 01/03/2011   Method used:   Electronically to        [facility]* (retail)       [address]       [place], [place]  [id number]       Ph: [phone]       Fax: [phone]   RxID:   [id number] SYNTHROID 50 MCG TABS (LEVOTHYROXINE SODIUM) 1 by mouth once daily  #30 x 11   Entered and Authorized by:   Judithann Sheen MD   Signed by:   Judithann Sheen MD on 01/03/2011   Method used:   Electronically to        [facility]* (retail)       [address]       [place], [place]  [id number]       Ph: [phone]       Fax: [phone]   RxID:   [id number] NORVASC 5 MG  TABS (AMLODIPINE BESYLATE) once daily  #30 x 11   Entered and Authorized by:   Judithann Sheen MD   Signed by:   Judithann Sheen MD on 01/03/2011   Method used:   Electronically to        [facility]* (retail)       [address]       [place], [place]  [address]       Ph: [phone]       Fax: [phone]   RxID:   [id number] SIMVASTATIN 40 MG  TABS (SIMVASTATIN) TAKE ONE TABLET AT BEDTIME  #30 x 11   Entered and Authorized by:   Judithann Sheen MD   Signed by:   Judithann Sheen MD on 01/03/2011   Method used:   Electronically to        [facility]* (retail)       [address]       [place], [place]  [address]       Ph: [phone]       Fax: [phone]   RxID:   [id number]    Orders Added: 1)  Venipuncture [22025] 2)  Specimen Handling [99000] 3)  TLB-Lipid Panel [80061-LIPID] 4)  TLB-BMP (Basic Metabolic Panel-BMET) [80048-METABOL] 5)  TLB-CBC Platelet - w/Differential [85025-CBCD] 6)  TLB-Hepatic/Liver Function Pnl [80076-HEPATIC] 7)  TLB-TSH (Thyroid Stimulating Hormone) [84443-TSH] 8)  Prescription Created Electronically [G8553] 9)  Est. Patient Level IV [42706]

## 2011-01-17 NOTE — Progress Notes (Signed)
Summary: RLS  Phone Note Call from Patient Call back at Home Phone [phone]   Caller: Spouse -Homero Fellers Summary of Call: Pt spoke with Dr. Scotty Court about restless leg, it appears to be getting worse, unable to sleep last night.  Would like something called in to [facility] and request a call back. Initial call taken by: Trixie Dredge,  December 11, 2010 2:39 PM  Follow-up for Phone Call        per Dr Scotty Court call in mirapex and pt needs to call 1wk before she runs out of meds and let Dr Scotty Court know how she is doing Follow-up by: Alfred Levins, CMA,  December 12, 2010 12:11 PM

## 2011-01-31 ENCOUNTER — Other Ambulatory Visit: Payer: Self-pay | Admitting: Family Medicine

## 2011-02-12 ENCOUNTER — Telehealth: Payer: Self-pay | Admitting: Gastroenterology

## 2011-02-21 NOTE — Progress Notes (Signed)
Summary: triage   Phone Note Call from Patient Call back at Home Phone [phone]   Caller: spouse Homero Fellers Call For: any doc Dr Christella Hartigan DOD Reason for Call: Talk to Nurse Summary of Call: Patient wants to be seen tomorrow for stomach pain and reflux hx with Dr Doreatha Martin Initial call taken by: Tawni Levy,  February 12, 2011 2:12 PM  Follow-up for Phone Call        PT HUSBAND REPORTS PT HAS COUGH WTH MUCOUS, HAS ACID REFLUX IN THE PAST. NO CURRENT UPPER GI COMPLAINTS. LOWER ABD PAIN THAT IS CONSTANT.  PT HAS ALWAYS BEEN CONSTIPATED.  PT HAS NOT HAD ANY NAUSEA, VOMITING.  NO RECTAL BLEEDING, NO FEVER.  DR JYNWGNFA IS PCP, SHE SAW HIM 2 MONTHS AGO.  UNABLE TO GET IN WITH HIM.  HUSBAND WOULD LIKE PT SEEN SOON NO PREFERENCE OF DR.  PT HUSBAND WAS ADVISED THAT THEY NEED TO SEE PCP AND WE CAN SCHEDULE FIRST AVAILABLE APPT WITH A PHYSICIAN HERE.  WILL FORWARD TO DR Yasiel Goyne FOR REVIEW.  PT CAN BE ADDED TO THE CANCELLATION LIST. Follow-up by: Chales Abrahams CMA Duncan Dull),  February 12, 2011 2:32 PM  Additional Follow-up for Phone Call Additional follow up Details #1::        sounds like a new pt evaluation, next available is reasonable.  I don't sense any urgency or emergency.  Additional Follow-up by: Rachael Fee MD,  February 12, 2011 2:44 PM    Additional Follow-up for Phone Call Additional follow up Details #2::    pt husband aware and will call back to schedule after he speaks with his wife. Follow-up by: Chales Abrahams CMA Duncan Dull),  February 12, 2011 2:56 PM

## 2011-03-11 ENCOUNTER — Other Ambulatory Visit: Payer: Self-pay | Admitting: Family Medicine

## 2011-03-12 ENCOUNTER — Other Ambulatory Visit: Payer: Self-pay | Admitting: Family Medicine

## 2011-03-12 NOTE — Telephone Encounter (Signed)
Done earlier today

## 2011-03-26 ENCOUNTER — Other Ambulatory Visit: Payer: Self-pay

## 2011-03-26 MED ORDER — ROPINIROLE HCL 5 MG PO TABS: 5.0000 mg | ORAL_TABLET | Freq: Every day | ORAL | Status: DC

## 2011-03-26 MED ORDER — PRAMIPEXOLE DIHYDROCHLORIDE 0.125 MG PO TABS: ORAL_TABLET | ORAL | Status: DC

## 2011-04-01 ENCOUNTER — Other Ambulatory Visit: Payer: Self-pay | Admitting: Family Medicine

## 2011-04-03 ENCOUNTER — Other Ambulatory Visit: Payer: Self-pay | Admitting: Family Medicine

## 2011-05-02 ENCOUNTER — Encounter: Payer: Self-pay | Admitting: Family Medicine

## 2011-05-02 ENCOUNTER — Ambulatory Visit (INDEPENDENT_AMBULATORY_CARE_PROVIDER_SITE_OTHER): Payer: Medicare Other | Admitting: Family Medicine

## 2011-05-02 VITALS — BP 102/82 | HR 82 | Temp 97.8°F | Wt 106.0 lb

## 2011-05-02 DIAGNOSIS — F341 Dysthymic disorder: Secondary | ICD-10-CM

## 2011-05-02 DIAGNOSIS — G44009 Cluster headache syndrome, unspecified, not intractable: Secondary | ICD-10-CM

## 2011-05-02 DIAGNOSIS — R42 Dizziness and giddiness: Secondary | ICD-10-CM

## 2011-05-02 DIAGNOSIS — I10 Essential (primary) hypertension: Secondary | ICD-10-CM

## 2011-05-02 DIAGNOSIS — F419 Anxiety disorder, unspecified: Secondary | ICD-10-CM

## 2011-05-02 MED ORDER — CYCLOBENZAPRINE HCL 10 MG PO TABS: ORAL_TABLET | ORAL | Status: DC

## 2011-05-02 NOTE — Patient Instructions (Signed)
You continue to have chronic dizziness with no new findings to explain the dizziness other than small vessel disease Continue your other medications as prescribed her Blood pressure is well-controlled

## 2011-05-02 NOTE — Progress Notes (Signed)
  Subjective:    Patient ID: Alexis Smith, female    DOB: 1916/01/14, 77 y.o.   MRN: [id number] This 77 year old white female with anxiety with her husband complaining of dizziness of which she has had chronic dizziness for some time. They have found that Dramamine is very effective but desired to be checked for other etiology of the dizziness. Blood pressure is under control 102/82 She relates she continues to have restless leg syndrome and is controlled her well by taking 5 mg Requip uric have her in addition to this she has episodes of muscle spasm and would like a muscle relaxants. She continues to have headaches controlled with Fioricet as persisting anxiety depression which is helped by Zoloft and alprazolam continues to have GERD if she does not take her Prilosec 10 used to have some itching in your case in a row manner which is relieved with Analpram-HC cream HPI    Review of Systems see history of present illness    Objective:   Physical Exam the patient is a well-developed white female who appears younger than her given age of 77 HEENT negative no nystagmus no nasal congestion Heart normal sounds no murmurs regular rhythm Lungs clear to palpation percussion and auscultation Neurological examination reveals Romberg negative gait is essentially normal reflexes are 2+ bilaterally       Assessment & Plan:  Chronic dizziness to treat with Dramamine as needed Hypertension no change in medication Anxiety depression no change in treatment Proctitis continued to use Analpram-HC Muscle spasm and treated with Flexeril 10 mg t.i.d.

## 2011-05-03 NOTE — Discharge Summary (Signed)
NAME:  Alexis Smith, Alexis Smith NO.:  [id number]   MEDICAL RECORD NO.:  [id number]                   PATIENT TYPE:  INP   LOCATION:  0377                                 FACILITY:  [facility]   PHYSICIAN:  Sherin Quarry, MD                   DATE OF BIRTH:  1916-10-09   DATE OF ADMISSION:  01/28/2003  DATE OF DISCHARGE:                                 DISCHARGE SUMMARY   HISTORY:  Alexis Smith is an 77 year old lady who presented on February 13 to  the [facility] Emergency Room with a history of getting out of bed and  walking to the bathroom around midnight. While in the bathroom, she fell and  hit her head. She has very poor recollection for this event. There did  appear to have been any loss of consciousness. Upon presentation to the  emergency room, the patient was found to have a head laceration which was  treated.   Laboratory studies done included a CBC which revealed a white count of 7000,  hemoglobin of 12.6, sodium was 140, potassium 3.5, glucose 93, creatinine  1.1, BUN 11, liver profile was normal.   Numerous x-ray studies were done. These included a CT scan of the head which  was negative. CT scan of the spine showed spondylolysis with 3 mm of  anterior subluxation of C4 and C5 which was thought to probably be chronic  and related to the patient's underlying arthritis. CT scans were also done  of the lumbar spine which revealed a grade 1 subluxation at L3 on L4 and L4  on L5. Again this was thought to be due to the patient's chronic arthritis  rather than to acute injury. The patient also had an x-ray done of her elbow  which was negative.   A decision was made to admit the patient for observation in light of her  advanced age and the trauma which she suffered as a result of the fall.  Review of the patient's medication suggested that these may have placed a  role in her falling. She is currently taking 200 mg of Zoloft. She is also  taking Ambien 5  mg at bedtime, Xanax 0.5 mg at bedtime and Xanax 0.25 mg in  the morning. It seems that she also takes diazepam as her husband found an  empty bottle of this medication although she has no recollection of taking  it. She apparently also takes Butalbital for her arthritis although her  husband was not able to find a bottle of this medication. Her husband had  repeatedly emphasized the importance of her not getting out of bed without  help, but she unfortunately had ignored his recommendations. Initially when  the patient was in the emergency room, a cervical spine brace was placed but  this made the patient very unhappy and therefore on February 13, this was  removed. The patient has excellent range of motion in her neck and does not  appear to need a brace. On February 14, a decision was made to discharge the  patient.   DISCHARGE DIAGNOSES:  1. Fall with head trauma likely secondary to multiple sedative medications.  2. Depression, situational.  3. Hypothyroidism.  4. Arthritis.   DISCHARGE MEDICATIONS:  1. Xanax 0.25 mg in the a.m., 0.5 mg in the p.m.  2. Lovastatin 20 mg daily.  3. Prilosec 20 mg daily.  4. Zoloft 200 mg daily.  5. Synthroid 75 mcg daily.  6. Ambien 5 mg at bedtime p.r.n. for sleep.   The patient was instructed that if she was going to take these medications  that she must not get out of bed without help.   DISCHARGE INSTRUCTIONS:  She will continue to followup with her primary care  physician, Dr. Scotty Court, as well as with Dr. Dub Mikes who is her psychiatry  doctor.   CONDITION ON DISCHARGE:  Good.                                                Sherin Quarry, MD    SY/MEDQ  D:  01/29/2003  T:  01/29/2003  Job:  [id number]   cc:   Ellin Saba., M.D.  [address] [place]  [place] [address]  Fax: [phone]   Geoffery Lyons, M.D.

## 2011-07-01 ENCOUNTER — Encounter: Payer: Self-pay | Admitting: Internal Medicine

## 2011-07-10 ENCOUNTER — Encounter: Payer: Self-pay | Admitting: Internal Medicine

## 2011-07-10 ENCOUNTER — Other Ambulatory Visit: Payer: Self-pay | Admitting: Family Medicine

## 2011-07-11 ENCOUNTER — Other Ambulatory Visit: Payer: Self-pay

## 2011-07-15 ENCOUNTER — Encounter: Payer: Self-pay | Admitting: Internal Medicine

## 2011-07-15 ENCOUNTER — Ambulatory Visit (INDEPENDENT_AMBULATORY_CARE_PROVIDER_SITE_OTHER): Payer: Medicare Other | Admitting: Internal Medicine

## 2011-07-15 ENCOUNTER — Other Ambulatory Visit (INDEPENDENT_AMBULATORY_CARE_PROVIDER_SITE_OTHER): Payer: Medicare Other

## 2011-07-15 VITALS — BP 124/80 | HR 84 | Ht 60.0 in | Wt 105.0 lb

## 2011-07-15 DIAGNOSIS — D649 Anemia, unspecified: Secondary | ICD-10-CM

## 2011-07-15 DIAGNOSIS — K644 Residual hemorrhoidal skin tags: Secondary | ICD-10-CM

## 2011-07-15 DIAGNOSIS — R6889 Other general symptoms and signs: Secondary | ICD-10-CM

## 2011-07-15 LAB — VITAMIN B12: Vitamin B-12: 404 pg/mL (ref 211–911)

## 2011-07-15 LAB — IBC PANEL: Saturation Ratios: 8.6 % — ABNORMAL LOW (ref 20.0–50.0)

## 2011-07-15 LAB — CBC WITH DIFFERENTIAL/PLATELET
Basophils Relative: 0.5 % (ref 0.0–3.0)
Eosinophils Absolute: 0.1 10*3/uL (ref 0.0–0.7)
Hemoglobin: 10.4 g/dL — ABNORMAL LOW (ref 12.0–15.0)
Lymphocytes Relative: 17.1 % (ref 12.0–46.0)
MCHC: 32.1 g/dL (ref 30.0–36.0)
Neutro Abs: 4.8 10*3/uL (ref 1.4–7.7)
RBC: 3.8 Mil/uL — ABNORMAL LOW (ref 3.87–5.11)

## 2011-07-15 NOTE — Progress Notes (Signed)
Alexis Smith 22-Jan-1916 MRN [id number]    History of Present Illness:  This is a 77 year old white female with difficulty in evacuating stool. She wants to be sure she does not have cancer. Her last colonoscopy in December 2001 showed diverticulosis of the left colon. She had hemorrhoidal banding by Dr. Earlene Plater several years ago complicated by post procedure bleeding due to  slip of a band. She has a history of anemia with a low MCV of 77. Patient's last hemoglobin in January 2012 was 11.8, hematocrit 35.1. She denies rectal bleeding. Other medical problems include gastroesophageal reflux, chronic cough, history of hypothyroidism, hyperlipidemia. She is status post cholecystectomy, anxiety and depression.   Past Medical History  Diagnosis Date  . Arthritis   . Blood in stool   . FHx: allergies   . Thyroid condition   . Diverticulitis   . Anemia   . Anxiety   . Depression   . History of orthostatic hypotension   . Restless leg syndrome   . Peripheral neuropathy   . Insomnia   . Osteopenia   . GERD (gastroesophageal reflux disease)   . Hemorrhoidal skin tag   . Hyperlipidemia   . Hypothyroidism   . Diverticulosis    Past Surgical History  Procedure Date  . Abdominal hysterectomy 1960  . Breast lumpectomy     left  . Hand surgery     reports that she has never smoked. She has never used smokeless tobacco. She reports that she drinks alcohol. She reports that she does not use illicit drugs. family history includes Colon cancer in her brother; Dementia in her sister; and Stroke in her father. Allergies  Allergen Reactions  . Codeine Phosphate     REACTION: unspecified  . Tetanus Toxoid Adsorbed     REACTION: unspecified        Review of Systems: Denies dysphagia, odynophagia, shortness of breath or chest pain  The remainder of the 10  point ROS is negative except as outlined in H&P   Physical Exam: General appearance  Well developed, in no distress, appearing younger  than her stated age. Eyes- non icteric. HEENT nontraumatic, normocephalic. Mouth no lesions, tongue papillated, no cheilosis. Neck supple without adenopathy, thyroid not enlarged, no carotid bruits, no JVD. Lungs Clear to auscultation bilaterally. Cor normal S1 normal S2, regular rhythm , no murmur,  quiet precordium. Abdomen soft nontender with normoactive bowel sounds. Decreased muscle tone. No scars. Rectal:and anoscopic exam reveals normal perianal area. Normal to slightly decreased rectal sphincter tone. No significant external or internal hemorrhoids. Partial prolapse of  redundant rectal tissue. Rectal ampulla is spacious. Small amount of Hemoccult-negative stool. Extremities no pedal edema. Skin no lesions. Neurological alert and oriented x 3. Psychological normal mood and affect.  Assessment and Plan:  Problem #1 Rectocele and pelvic relaxation. There is no other rectal pathology. I advised her to use glycerin suppositories or Fleet enemas to evacuate the stool. I encouraged her to exercise to keep the muscle tone of the pelvic muscles. She should also follow a high fiber diet. We will check her blood count, chemistries, iron and B12 levels.   07/15/2011 Lina Sar

## 2011-07-15 NOTE — Patient Instructions (Signed)
Your physician has requested that you go to the basement for the following lab work before leaving today: CBC, B12 Level, IBC panel CC: Dr Scotty Court

## 2011-07-16 ENCOUNTER — Telehealth: Payer: Self-pay | Admitting: *Deleted

## 2011-07-16 DIAGNOSIS — D649 Anemia, unspecified: Secondary | ICD-10-CM

## 2011-07-16 NOTE — Telephone Encounter (Signed)
Patient notified of results and to start iron pills TID as per Dr. Juanda Chance.

## 2011-07-16 NOTE — Telephone Encounter (Signed)
Message copied by Alexis Smith on Tue Jul 16, 2011  8:24 AM ------      Message from: Hart Carwin      Created: Mon Jul 15, 2011 10:25 PM       Please call pt with low blood count due to low iron. Start FeSO4 325 mg po tid after her colonoscopy

## 2011-07-16 NOTE — Telephone Encounter (Signed)
does not have one and she does not need one, it was my mistake. Please start Iron now. Thanx ----- Message ----- From: Daphine Deutscher, RN Sent: 07/16/2011 8:27 AM To: Hart Carwin, MD  Dr. Juanda Chance, On the lab results, you ask for the patient to start iron after her colonoscopy. She does not have a colonoscopy scheduled. Does she need one? Rene Kocher

## 2011-07-17 ENCOUNTER — Telehealth: Payer: Self-pay | Admitting: Internal Medicine

## 2011-07-17 NOTE — Telephone Encounter (Signed)
Spoke with patients husband and told him she needs to take FeSO4 325 mg TID.

## 2011-07-19 ENCOUNTER — Other Ambulatory Visit: Payer: Self-pay

## 2011-07-24 ENCOUNTER — Other Ambulatory Visit: Payer: Self-pay | Admitting: Family Medicine

## 2011-08-02 ENCOUNTER — Telehealth: Payer: Self-pay | Admitting: Internal Medicine

## 2011-08-02 NOTE — Telephone Encounter (Signed)
Patient calling to report she has noticed her stools are black. Patient started iron tablets TID several weeks ago. She reports this is when she started noticing the change in color of stools. Discussed that iron tablets can cause this.

## 2011-08-08 ENCOUNTER — Telehealth: Payer: Self-pay | Admitting: Family Medicine

## 2011-08-08 NOTE — Telephone Encounter (Signed)
Dr. Scotty Court requesting to call pt himself

## 2011-08-08 NOTE — Telephone Encounter (Signed)
Pts feet are swollen and pt is losing weight. Pt doesn't feel like making an ov to come in. Pts spouse is req call back asap.

## 2011-08-16 NOTE — Telephone Encounter (Signed)
pts husband checked  On edema, no indentationi

## 2011-09-02 ENCOUNTER — Telehealth: Payer: Self-pay | Admitting: *Deleted

## 2011-09-02 ENCOUNTER — Encounter: Payer: Self-pay | Admitting: Internal Medicine

## 2011-09-02 ENCOUNTER — Ambulatory Visit (INDEPENDENT_AMBULATORY_CARE_PROVIDER_SITE_OTHER): Payer: Medicare Other | Admitting: Internal Medicine

## 2011-09-02 VITALS — BP 120/70 | Temp 98.2°F | Wt 105.0 lb

## 2011-09-02 DIAGNOSIS — K644 Residual hemorrhoidal skin tags: Secondary | ICD-10-CM

## 2011-09-02 DIAGNOSIS — T148XXA Other injury of unspecified body region, initial encounter: Secondary | ICD-10-CM

## 2011-09-02 DIAGNOSIS — D649 Anemia, unspecified: Secondary | ICD-10-CM

## 2011-09-02 DIAGNOSIS — Z23 Encounter for immunization: Secondary | ICD-10-CM

## 2011-09-02 DIAGNOSIS — G2581 Restless legs syndrome: Secondary | ICD-10-CM

## 2011-09-02 DIAGNOSIS — Z0000 Encounter for general adult medical examination without abnormal findings: Secondary | ICD-10-CM

## 2011-09-02 NOTE — Patient Instructions (Signed)
Call or return to clinic prn if these symptoms worsen or fail to improve as anticipated.  Call if the wound  involving the right arm becomes painful red or develops any drainage

## 2011-09-02 NOTE — Progress Notes (Signed)
  Subjective:    Patient ID: Alexis Smith, female    DOB: September 05, 1916, 77 y.o.   MRN: [id number]  HPI  a 77 year old patient who is seen today for followup. She fell 5 days ago in the same day contusion and skin tear involving her right elbow region. She does have some minor URI symptoms. She also has a history of femoral disease and has had some intermittent discomfort. She is followed by GI. She has a history of dyslipidemia osteopenia and a history of anemia. She describes some constipation issues. She is taking iron supplements twice daily.    Review of Systems  Constitutional: Negative.   HENT: Negative for hearing loss, congestion, sore throat, rhinorrhea, dental problem, sinus pressure and tinnitus.   Eyes: Negative for pain, discharge and visual disturbance.  Respiratory: Negative for cough and shortness of breath.   Cardiovascular: Negative for chest pain, palpitations and leg swelling.  Gastrointestinal: Negative for nausea, vomiting, abdominal pain, diarrhea, constipation, blood in stool and abdominal distention.  Genitourinary: Negative for dysuria, urgency, frequency, hematuria, flank pain, vaginal bleeding, vaginal discharge, difficulty urinating, vaginal pain and pelvic pain.  Musculoskeletal: Negative for joint swelling, arthralgias and gait problem.  Skin: Positive for wound. Negative for rash.  Neurological: Negative for dizziness, syncope, speech difficulty, weakness, numbness and headaches.  Hematological: Negative for adenopathy.  Psychiatric/Behavioral: Negative for behavioral problems, dysphoric mood and agitation. The patient is not nervous/anxious.        Objective:   Physical Exam  Constitutional: She is oriented to person, place, and time. She appears well-developed and well-nourished. No distress.       Alert appropriate no distress  HENT:  Head: Normocephalic.  Right Ear: External ear normal.  Left Ear: External ear normal.  Mouth/Throat: Oropharynx is clear  and moist.  Eyes: Conjunctivae and EOM are normal. Pupils are equal, round, and reactive to light.  Neck: Normal range of motion. Neck supple. No thyromegaly present.  Cardiovascular: Normal rate, regular rhythm, normal heart sounds and intact distal pulses.   Pulmonary/Chest: Effort normal and breath sounds normal.  Abdominal: Soft. Bowel sounds are normal. She exhibits no mass. There is no tenderness.  Musculoskeletal: Normal range of motion.  Lymphadenopathy:    She has no cervical adenopathy.  Neurological: She is alert and oriented to person, place, and time.  Skin: Skin is warm and dry. Rash noted.       Minor contusion and superficial skin tear right outer arm just distal to the elbow  Psychiatric: She has a normal mood and affect. Her behavior is normal.          Assessment & Plan:   Contusion skin tear right elbow Hemorrhoidal disease. Constipation issues were discussed Mild URI  We'll treat symptomatically local skin care discussed

## 2011-09-02 NOTE — Telephone Encounter (Signed)
Please call Ms Gilcrest re: some bleeding through her bandage.  Just left the office.

## 2011-09-02 NOTE — Telephone Encounter (Signed)
Spoke with husband, gave instructions on gentle pressure , ok to use neosporin and large bandaid - call if bleeding does not stop. KIK

## 2011-09-10 ENCOUNTER — Other Ambulatory Visit: Payer: Self-pay | Admitting: Family Medicine

## 2011-09-29 ENCOUNTER — Other Ambulatory Visit: Payer: Self-pay | Admitting: Family Medicine

## 2011-10-05 ENCOUNTER — Other Ambulatory Visit: Payer: Self-pay | Admitting: Family Medicine

## 2011-10-10 ENCOUNTER — Other Ambulatory Visit: Payer: Self-pay | Admitting: Family Medicine

## 2011-10-10 NOTE — Telephone Encounter (Signed)
Pt is needing refill of ZOLOFT 100 MG tablet to [facility] [phone]

## 2011-10-11 MED ORDER — SERTRALINE HCL 100 MG PO TABS: 100.0000 mg | ORAL_TABLET | Freq: Every day | ORAL | Status: DC

## 2011-10-11 NOTE — Telephone Encounter (Signed)
pls advise

## 2011-10-11 NOTE — Telephone Encounter (Signed)
90

## 2011-10-11 NOTE — Telephone Encounter (Signed)
rx sent into pharmacy

## 2011-10-28 ENCOUNTER — Other Ambulatory Visit: Payer: Self-pay | Admitting: Internal Medicine

## 2011-10-28 ENCOUNTER — Ambulatory Visit (INDEPENDENT_AMBULATORY_CARE_PROVIDER_SITE_OTHER): Payer: BC Managed Care – PPO | Admitting: Surgery

## 2011-11-13 ENCOUNTER — Ambulatory Visit: Payer: BC Managed Care – PPO | Admitting: Internal Medicine

## 2011-11-13 ENCOUNTER — Other Ambulatory Visit: Payer: Self-pay | Admitting: Internal Medicine

## 2011-11-13 ENCOUNTER — Other Ambulatory Visit (INDEPENDENT_AMBULATORY_CARE_PROVIDER_SITE_OTHER): Payer: Medicare Other

## 2011-11-13 ENCOUNTER — Ambulatory Visit (INDEPENDENT_AMBULATORY_CARE_PROVIDER_SITE_OTHER): Payer: Medicare Other | Admitting: Internal Medicine

## 2011-11-13 ENCOUNTER — Encounter: Payer: Self-pay | Admitting: Internal Medicine

## 2011-11-13 ENCOUNTER — Telehealth: Payer: Self-pay | Admitting: Internal Medicine

## 2011-11-13 VITALS — BP 110/70 | HR 72 | Temp 97.9°F | Resp 16 | Ht 61.0 in | Wt 102.0 lb

## 2011-11-13 DIAGNOSIS — R209 Unspecified disturbances of skin sensation: Secondary | ICD-10-CM

## 2011-11-13 DIAGNOSIS — R634 Abnormal weight loss: Secondary | ICD-10-CM

## 2011-11-13 DIAGNOSIS — M949 Disorder of cartilage, unspecified: Secondary | ICD-10-CM

## 2011-11-13 DIAGNOSIS — D649 Anemia, unspecified: Secondary | ICD-10-CM

## 2011-11-13 DIAGNOSIS — E039 Hypothyroidism, unspecified: Secondary | ICD-10-CM

## 2011-11-13 DIAGNOSIS — M8580 Other specified disorders of bone density and structure, unspecified site: Secondary | ICD-10-CM

## 2011-11-13 DIAGNOSIS — M79609 Pain in unspecified limb: Secondary | ICD-10-CM

## 2011-11-13 DIAGNOSIS — M899 Disorder of bone, unspecified: Secondary | ICD-10-CM

## 2011-11-13 LAB — URINALYSIS
Bilirubin Urine: NEGATIVE
Ketones, ur: NEGATIVE
Specific Gravity, Urine: 1.02 (ref 1.000–1.030)
Total Protein, Urine: 100
Urine Glucose: NEGATIVE
pH: 6 (ref 5.0–8.0)

## 2011-11-13 LAB — BASIC METABOLIC PANEL
BUN: 19 mg/dL (ref 6–23)
CO2: 29 mEq/L (ref 19–32)
Chloride: 102 mEq/L (ref 96–112)
Creatinine, Ser: 0.9 mg/dL (ref 0.4–1.2)
Potassium: 4.2 mEq/L (ref 3.5–5.1)

## 2011-11-13 LAB — LIPID PANEL
Total CHOL/HDL Ratio: 2
VLDL: 27.4 mg/dL (ref 0.0–40.0)

## 2011-11-13 LAB — CBC WITH DIFFERENTIAL/PLATELET
Basophils Relative: 0.4 % (ref 0.0–3.0)
Eosinophils Relative: 0.8 % (ref 0.0–5.0)
Hemoglobin: 13.6 g/dL (ref 12.0–15.0)
Lymphocytes Relative: 18.3 % (ref 12.0–46.0)
Monocytes Relative: 6 % (ref 3.0–12.0)
Neutro Abs: 4.7 10*3/uL (ref 1.4–7.7)
RBC: 4.26 Mil/uL (ref 3.87–5.11)
WBC: 6.3 10*3/uL (ref 4.5–10.5)

## 2011-11-13 LAB — URINALYSIS, ROUTINE W REFLEX MICROSCOPIC
Total Protein, Urine: 100
Urobilinogen, UA: 0.2 (ref 0.0–1.0)
pH: 6 (ref 5.0–8.0)

## 2011-11-13 LAB — LDL CHOLESTEROL, DIRECT: Direct LDL: 101.7 mg/dL

## 2011-11-13 LAB — SEDIMENTATION RATE: Sed Rate: 20 mm/hr (ref 0–22)

## 2011-11-13 MED ORDER — CIPROFLOXACIN HCL 250 MG PO TABS: 250.0000 mg | ORAL_TABLET | Freq: Two times a day (BID) | ORAL | Status: AC

## 2011-11-13 MED ORDER — SIMVASTATIN 40 MG PO TABS: 20.0000 mg | ORAL_TABLET | Freq: Every day | ORAL | Status: DC

## 2011-11-13 NOTE — Telephone Encounter (Signed)
Alexis Smith, please, inform patient that she has a UTI - needs to take Cipro

## 2011-11-13 NOTE — Progress Notes (Signed)
  Subjective:    Patient ID: Alexis Smith, female    DOB: [birth year], 76 y.o.   MRN: [id number]  HPI New pt - used to see Dr Scotty Court C/o 12 lbs wt loss in 8 mo The patient presents for a follow-up of  Chronic hypothyroidism, hypertension, chronic dyslipidemia   Review of Systems  Constitutional: Positive for unexpected weight change. Negative for chills, activity change, appetite change and fatigue.  HENT: Negative for congestion, mouth sores and sinus pressure.   Eyes: Negative for visual disturbance.  Respiratory: Negative for cough and chest tightness.   Gastrointestinal: Negative for nausea and abdominal pain.  Genitourinary: Negative for frequency, difficulty urinating and vaginal pain.  Musculoskeletal: Negative for back pain and gait problem.  Skin: Negative for pallor and rash.  Neurological: Negative for dizziness, tremors, weakness, numbness and headaches.  Psychiatric/Behavioral: Negative for confusion and sleep disturbance.   Wt Readings from Last 3 Encounters:  11/13/11 102 lb (46.267 kg)  09/02/11 105 lb (47.628 kg)  07/15/11 105 lb (47.628 kg)        Objective:   Physical Exam  Constitutional: She appears well-developed and well-nourished. No distress.  HENT:  Head: Normocephalic.  Right Ear: External ear normal.  Left Ear: External ear normal.  Nose: Nose normal.  Mouth/Throat: Oropharynx is clear and moist.  Eyes: Conjunctivae are normal. Pupils are equal, round, and reactive to light. Right eye exhibits no discharge. Left eye exhibits no discharge.  Neck: Normal range of motion. Neck supple. No JVD present. No tracheal deviation present. No thyromegaly present.  Cardiovascular: Normal rate, regular rhythm and normal heart sounds.   Pulmonary/Chest: No stridor. No respiratory distress. She has no wheezes.  Abdominal: Soft. Bowel sounds are normal. She exhibits no distension and no mass. There is no tenderness. There is no rebound and no guarding.    Musculoskeletal: She exhibits no edema and no tenderness.  Lymphadenopathy:    She has no cervical adenopathy.  Neurological: She displays normal reflexes. No cranial nerve deficit. She exhibits normal muscle tone. Coordination normal.  Skin: No rash noted. No erythema.  Psychiatric: She has a normal mood and affect. Her behavior is normal. Judgment and thought content normal.    Lab Results  Component Value Date   WBC 6.4 07/15/2011   HGB 10.4* 07/15/2011   HCT 32.3* 07/15/2011   PLT 256.0 07/15/2011   GLUCOSE 95 01/03/2011   CHOL 220* 01/03/2011   TRIG 185.0* 01/03/2011   HDL 75.80 01/03/2011   LDLDIRECT 119.0 01/03/2011   ALT 11 01/03/2011   AST 25 01/03/2011   NA 137 01/03/2011   K 4.1 01/03/2011   CL 101 01/03/2011   CREATININE 0.8 01/03/2011   BUN 22 01/03/2011   CO2 28 01/03/2011   TSH 3.18 01/03/2011         Assessment & Plan:

## 2011-11-13 NOTE — Assessment & Plan Note (Signed)
Continue with current prescription therapy as reflected on the Med list.  

## 2011-11-13 NOTE — Telephone Encounter (Signed)
Alexis Smith, please, inform patient that all labs are normal; anemia has resolved Thx

## 2011-11-14 ENCOUNTER — Encounter: Payer: Self-pay | Admitting: Internal Medicine

## 2011-11-14 LAB — VITAMIN D 25 HYDROXY (VIT D DEFICIENCY, FRACTURES): Vit D, 25-Hydroxy: 47 ng/mL (ref 30–89)

## 2011-11-14 NOTE — Assessment & Plan Note (Signed)
Mild. Will obtain labs and see if we need to pursue it further. She had seen Dr Juanda Chance recently.

## 2011-11-14 NOTE — Telephone Encounter (Signed)
Pt's husband informed.

## 2011-12-03 ENCOUNTER — Telehealth: Payer: Self-pay | Admitting: *Deleted

## 2011-12-03 DIAGNOSIS — R1030 Lower abdominal pain, unspecified: Secondary | ICD-10-CM

## 2011-12-03 NOTE — Telephone Encounter (Signed)
Pt walked in stating she completed cipro for UTI. She now c/o lower abd pain. She is asking what else she should do. Please advise/

## 2011-12-03 NOTE — Telephone Encounter (Signed)
Repeat UA and urine cx Thx

## 2011-12-04 ENCOUNTER — Other Ambulatory Visit (INDEPENDENT_AMBULATORY_CARE_PROVIDER_SITE_OTHER): Payer: Medicare Other

## 2011-12-04 DIAGNOSIS — R1030 Lower abdominal pain, unspecified: Secondary | ICD-10-CM

## 2011-12-04 DIAGNOSIS — R109 Unspecified abdominal pain: Secondary | ICD-10-CM

## 2011-12-04 LAB — URINALYSIS, ROUTINE W REFLEX MICROSCOPIC
Bilirubin Urine: NEGATIVE
Nitrite: POSITIVE
Urobilinogen, UA: 0.2 (ref 0.0–1.0)
pH: 6.5 (ref 5.0–8.0)

## 2011-12-04 NOTE — Telephone Encounter (Signed)
Pt's husband informed.

## 2011-12-05 ENCOUNTER — Telehealth: Payer: Self-pay | Admitting: Internal Medicine

## 2011-12-05 MED ORDER — CEFUROXIME AXETIL 250 MG PO TABS: 250.0000 mg | ORAL_TABLET | Freq: Two times a day (BID) | ORAL | Status: AC

## 2011-12-05 NOTE — Telephone Encounter (Signed)
Alexis Smith, please, inform patient that she has UTI, take Ceftin Urine cx Thx

## 2011-12-06 ENCOUNTER — Other Ambulatory Visit: Payer: Self-pay | Admitting: Family Medicine

## 2011-12-06 LAB — CULTURE, URINE COMPREHENSIVE

## 2011-12-06 NOTE — Telephone Encounter (Signed)
Pt's husband informed. Urine specimen has been discarded.

## 2011-12-09 ENCOUNTER — Other Ambulatory Visit: Payer: Self-pay | Admitting: Family Medicine

## 2011-12-12 ENCOUNTER — Other Ambulatory Visit: Payer: Self-pay | Admitting: Family Medicine

## 2011-12-19 ENCOUNTER — Telehealth: Payer: Self-pay | Admitting: Internal Medicine

## 2011-12-19 MED ORDER — BUTALBITAL-APAP-CAFFEINE 50-325-40 MG PO TABS: 2.0000 | ORAL_TABLET | Freq: Four times a day (QID) | ORAL | Status: DC | PRN

## 2011-12-19 NOTE — Telephone Encounter (Signed)
REQUESTING REFILL ON BUTALBITAL APAP-CAFFEINE QUANITY OF 100 TABLETS.  NO STRENGTH ON THE BOTTLE.  [facility] [phone].

## 2011-12-19 NOTE — Telephone Encounter (Signed)
done

## 2011-12-26 ENCOUNTER — Ambulatory Visit: Payer: BC Managed Care – PPO | Admitting: Internal Medicine

## 2011-12-31 ENCOUNTER — Other Ambulatory Visit: Payer: Self-pay | Admitting: Internal Medicine

## 2012-01-09 ENCOUNTER — Other Ambulatory Visit (INDEPENDENT_AMBULATORY_CARE_PROVIDER_SITE_OTHER): Payer: Self-pay | Admitting: General Surgery

## 2012-01-09 ENCOUNTER — Encounter (INDEPENDENT_AMBULATORY_CARE_PROVIDER_SITE_OTHER): Payer: Self-pay | Admitting: Surgery

## 2012-01-09 ENCOUNTER — Ambulatory Visit (INDEPENDENT_AMBULATORY_CARE_PROVIDER_SITE_OTHER): Payer: Medicare Other | Admitting: Surgery

## 2012-01-09 VITALS — BP 112/76 | HR 80 | Temp 97.7°F | Resp 16 | Ht 60.0 in | Wt 98.0 lb

## 2012-01-09 DIAGNOSIS — R109 Unspecified abdominal pain: Secondary | ICD-10-CM

## 2012-01-09 DIAGNOSIS — R634 Abnormal weight loss: Secondary | ICD-10-CM

## 2012-01-09 NOTE — Progress Notes (Signed)
Mr. And Mrs Polhamus came in to see me regarding her anal discomfort (fissure and ?prolapse)  She has had surgery by Dr. Earlene Plater had been after that hemorrhoidectomy she has had some difficulties. He followed her for a long time and gave her some diltiazem cream. On examination she has a very tiny posterior fissure and very large external hemorrhoid/skin tags.  Of even greater concern to me however he is her 6 month history of unexplained weight loss. She has no palpable adenopathy that I could appreciate her head and neck and thyroid feels okay. Her chest is thin and she has no chronic cough. Her abdomen is thin but firm and she's been having some pain on the left side. I want to obtain a CT scan of her abdomen and pelvis and we will go in and schedule that along with a chest x-ray. I'll see her back after that is performed we can dartos well we need to do. He may be that some form of an external skin tag removal will be in order although her age I to subject her to anesthesia if I can avoid it.

## 2012-01-09 NOTE — Progress Notes (Signed)
Addended by: Latricia Heft on: 01/09/2012 12:27 PM   Modules accepted: Orders

## 2012-01-13 ENCOUNTER — Ambulatory Visit (INDEPENDENT_AMBULATORY_CARE_PROVIDER_SITE_OTHER): Payer: Medicare Other | Admitting: Internal Medicine

## 2012-01-13 ENCOUNTER — Telehealth: Payer: Self-pay | Admitting: Internal Medicine

## 2012-01-13 ENCOUNTER — Encounter: Payer: Self-pay | Admitting: Internal Medicine

## 2012-01-13 ENCOUNTER — Other Ambulatory Visit: Payer: Self-pay | Admitting: *Deleted

## 2012-01-13 ENCOUNTER — Other Ambulatory Visit (INDEPENDENT_AMBULATORY_CARE_PROVIDER_SITE_OTHER): Payer: Medicare Other

## 2012-01-13 VITALS — BP 130/90 | HR 84 | Temp 97.6°F | Resp 16 | Wt 97.0 lb

## 2012-01-13 DIAGNOSIS — R109 Unspecified abdominal pain: Secondary | ICD-10-CM

## 2012-01-13 DIAGNOSIS — R634 Abnormal weight loss: Secondary | ICD-10-CM

## 2012-01-13 DIAGNOSIS — N390 Urinary tract infection, site not specified: Secondary | ICD-10-CM

## 2012-01-13 DIAGNOSIS — R1310 Dysphagia, unspecified: Secondary | ICD-10-CM

## 2012-01-13 LAB — URINALYSIS, ROUTINE W REFLEX MICROSCOPIC
Nitrite: NEGATIVE
Total Protein, Urine: 30
pH: 7 (ref 5.0–8.0)

## 2012-01-13 MED ORDER — OMEPRAZOLE 20 MG PO CPDR: 20.0000 mg | DELAYED_RELEASE_CAPSULE | Freq: Every day | ORAL | Status: DC

## 2012-01-13 NOTE — Telephone Encounter (Signed)
Alexis Smith, please, inform patient that her UA was OK - let's wait for CT results Thx

## 2012-01-13 NOTE — Assessment & Plan Note (Signed)
CT abd is sch for tomorrow

## 2012-01-13 NOTE — Progress Notes (Signed)
  Subjective:    Patient ID: Alexis Smith, female    DOB: 02-Sep-1916, 78 y.o.   MRN: [id number]  HPI   C/o LLQ abd pain and sweats, sweating at night and weakness x 2 mo; wt loss  Wt Readings from Last 3 Encounters:  01/13/12 97 lb (43.999 kg)  01/09/12 98 lb (44.453 kg)  11/13/11 102 lb (46.267 kg)     Review of Systems  Constitutional: Positive for chills, diaphoresis, fatigue and unexpected weight change (lost 20 lbs). Negative for activity change and appetite change.  HENT: Negative for congestion, mouth sores and sinus pressure.   Eyes: Negative for visual disturbance.  Respiratory: Negative for cough and chest tightness.   Gastrointestinal: Positive for abdominal pain (LLQ). Negative for nausea and blood in stool.  Genitourinary: Negative for frequency, difficulty urinating and vaginal pain.  Musculoskeletal: Negative for back pain and gait problem.  Skin: Negative for pallor and rash.  Neurological: Positive for weakness. Negative for dizziness, tremors, numbness and headaches.  Psychiatric/Behavioral: Negative for confusion and sleep disturbance.       Objective:   Physical Exam  Constitutional: She appears well-developed. No distress.       Thin   HENT:  Head: Normocephalic.  Right Ear: External ear normal.  Left Ear: External ear normal.  Nose: Nose normal.  Mouth/Throat: Oropharynx is clear and moist.  Eyes: Conjunctivae are normal. Pupils are equal, round, and reactive to light. Right eye exhibits no discharge. Left eye exhibits no discharge.  Neck: Normal range of motion. Neck supple. No JVD present. No tracheal deviation present. No thyromegaly present.  Cardiovascular: Normal rate, regular rhythm and normal heart sounds.   Pulmonary/Chest: No stridor. No respiratory distress. She has no wheezes.  Abdominal: Soft. Bowel sounds are normal. She exhibits no distension and no mass. There is tenderness (LLQ). There is no rebound and no guarding.  Musculoskeletal:  She exhibits no edema and no tenderness.  Lymphadenopathy:    She has no cervical adenopathy.  Neurological: She displays normal reflexes. No cranial nerve deficit. She exhibits normal muscle tone. Coordination normal.  Skin: No rash noted. No erythema.  Psychiatric: She has a normal mood and affect. Her behavior is normal. Judgment and thought content normal.   Lab Results  Component Value Date   WBC 6.3 11/13/2011   HGB 13.6 11/13/2011   HCT 40.5 11/13/2011   PLT 212.0 11/13/2011   GLUCOSE 84 11/13/2011   CHOL 203* 11/13/2011   TRIG 137.0 11/13/2011   HDL 82.50 11/13/2011   LDLDIRECT 101.7 11/13/2011   ALT 11 01/03/2011   AST 25 01/03/2011   NA 142 11/13/2011   K 4.2 11/13/2011   CL 102 11/13/2011   CREATININE 0.9 11/13/2011   BUN 19 11/13/2011   CO2 29 11/13/2011   TSH 1.88 11/13/2011          Assessment & Plan:

## 2012-01-13 NOTE — Assessment & Plan Note (Signed)
Recheck UA

## 2012-01-13 NOTE — Assessment & Plan Note (Signed)
x6 mo ? etiol May need an EGD

## 2012-01-14 ENCOUNTER — Other Ambulatory Visit (INDEPENDENT_AMBULATORY_CARE_PROVIDER_SITE_OTHER): Payer: Self-pay | Admitting: Surgery

## 2012-01-14 ENCOUNTER — Ambulatory Visit (HOSPITAL_COMMUNITY)
Admission: RE | Admit: 2012-01-14 | Discharge: 2012-01-14 | Disposition: A | Discharge status: Home / Self Care | Payer: Medicare Other | Source: Ambulatory Visit | Attending: Surgery | Admitting: Surgery

## 2012-01-14 DIAGNOSIS — R634 Abnormal weight loss: Secondary | ICD-10-CM

## 2012-01-14 DIAGNOSIS — J9819 Other pulmonary collapse: Secondary | ICD-10-CM | POA: Insufficient documentation

## 2012-01-14 DIAGNOSIS — I10 Essential (primary) hypertension: Secondary | ICD-10-CM | POA: Insufficient documentation

## 2012-01-14 DIAGNOSIS — K5900 Constipation, unspecified: Secondary | ICD-10-CM | POA: Insufficient documentation

## 2012-01-14 DIAGNOSIS — R109 Unspecified abdominal pain: Secondary | ICD-10-CM | POA: Insufficient documentation

## 2012-01-14 DIAGNOSIS — K449 Diaphragmatic hernia without obstruction or gangrene: Secondary | ICD-10-CM | POA: Insufficient documentation

## 2012-01-14 DIAGNOSIS — N289 Disorder of kidney and ureter, unspecified: Secondary | ICD-10-CM | POA: Insufficient documentation

## 2012-01-14 NOTE — Telephone Encounter (Signed)
Pt's husband informed.

## 2012-01-16 ENCOUNTER — Telehealth (INDEPENDENT_AMBULATORY_CARE_PROVIDER_SITE_OTHER): Payer: Self-pay | Admitting: General Surgery

## 2012-01-16 NOTE — Telephone Encounter (Signed)
Spoke with the patients husband to give him results of CT/ he understood that she has a hiatal hernia and states she has been on prilosec for years

## 2012-01-17 ENCOUNTER — Other Ambulatory Visit: Payer: Self-pay | Admitting: Internal Medicine

## 2012-01-17 ENCOUNTER — Telehealth: Payer: Self-pay | Admitting: *Deleted

## 2012-01-17 NOTE — Telephone Encounter (Signed)
Requested Medications     HYCODAN 5-1.5 MG/5ML syrup [Pharmacy Med Name: HYCODAN SYRUP]   TAKE 1 OR 2 TEASPOONFULS EVERY 4 HOURS AS NEEDED FOR COUGH (NO MORE THAN 2 TEASPOONFULS PER DAY)   Disp: 240 each Start: 01/17/2012  Class: Normal   Requested on: 07/25/2011   Originally ordered on: 05/02/2011  Last refill: 07/25/2011

## 2012-01-20 ENCOUNTER — Other Ambulatory Visit: Payer: Self-pay | Admitting: Internal Medicine

## 2012-01-20 MED ORDER — HYDROCODONE-HOMATROPINE 5-1.5 MG/5ML PO SYRP: 5.0000 mL | ORAL_SOLUTION | ORAL | Status: DC | PRN

## 2012-01-20 NOTE — Telephone Encounter (Signed)
OK to fill this prescription with additional refills x0 Thank you!  

## 2012-01-21 ENCOUNTER — Telehealth: Payer: Self-pay | Admitting: *Deleted

## 2012-01-21 ENCOUNTER — Other Ambulatory Visit: Payer: Self-pay | Admitting: Family Medicine

## 2012-01-21 NOTE — Telephone Encounter (Signed)
Pt's husband left vm requesting results of xray and CT she recently had. Please advise.

## 2012-01-21 NOTE — Telephone Encounter (Signed)
Number busy- will try again tom.

## 2012-01-21 NOTE — Telephone Encounter (Signed)
It was ok overall. There was a lot of stool back up - she needs to have a Fleet enema once and take Senakot S 2 a day for regular BMs Thx

## 2012-01-22 NOTE — Telephone Encounter (Signed)
Pt informed

## 2012-01-23 ENCOUNTER — Other Ambulatory Visit: Payer: Self-pay | Admitting: Internal Medicine

## 2012-02-06 ENCOUNTER — Other Ambulatory Visit: Payer: Self-pay | Admitting: Family Medicine

## 2012-02-07 ENCOUNTER — Telehealth: Payer: Self-pay | Admitting: Internal Medicine

## 2012-02-07 MED ORDER — SERTRALINE HCL 100 MG PO TABS: ORAL_TABLET | ORAL | Status: DC

## 2012-02-07 NOTE — Telephone Encounter (Signed)
Requesting zoloft to be sent to [facility].  She will be out on Saturday.

## 2012-02-07 NOTE — Telephone Encounter (Signed)
Done. Pt's spouse informed.  

## 2012-02-12 ENCOUNTER — Encounter: Payer: Self-pay | Admitting: Internal Medicine

## 2012-02-12 ENCOUNTER — Ambulatory Visit (INDEPENDENT_AMBULATORY_CARE_PROVIDER_SITE_OTHER): Payer: Medicare Other | Admitting: Internal Medicine

## 2012-02-12 VITALS — BP 150/90 | HR 84 | Temp 97.7°F | Resp 16 | Wt 97.4 lb

## 2012-02-12 DIAGNOSIS — K5900 Constipation, unspecified: Secondary | ICD-10-CM

## 2012-02-12 DIAGNOSIS — H9190 Unspecified hearing loss, unspecified ear: Secondary | ICD-10-CM | POA: Insufficient documentation

## 2012-02-12 DIAGNOSIS — R634 Abnormal weight loss: Secondary | ICD-10-CM

## 2012-02-12 MED ORDER — DILTIAZEM HCL POWD: Status: DC

## 2012-02-12 MED ORDER — MIRTAZAPINE 15 MG PO TBDP: 15.0000 mg | ORAL_TABLET | Freq: Every day | ORAL | Status: DC

## 2012-02-12 NOTE — Assessment & Plan Note (Signed)
Continue with current prescription therapy as reflected on the Med list.  

## 2012-02-12 NOTE — Assessment & Plan Note (Signed)
Will ref to [facility] 

## 2012-02-12 NOTE — Assessment & Plan Note (Signed)
Better  

## 2012-02-12 NOTE — Progress Notes (Signed)
Patient ID: Alexis Smith, female   DOB: [birth year], 78 y.o.   MRN: [id number]  Subjective:    Patient ID: Alexis Smith, female    DOB: 01-11-1916, 78 y.o.   MRN: [id number]  Leg Pain  Pertinent negatives include no numbness.     C/o LLQ abd pain and sweats, sweating at night and weakness x 3 mo -- better; wt loss -- better  Wt Readings from Last 3 Encounters:  02/12/12 97 lb 6.4 oz (44.18 kg)  01/13/12 97 lb (43.999 kg)  01/09/12 98 lb (44.453 kg)   BP Readings from Last 3 Encounters:  02/12/12 150/90  01/13/12 130/90  01/09/12 112/76     Review of Systems  Constitutional: Positive for chills, diaphoresis, fatigue and unexpected weight change (lost 20 lbs). Negative for activity change and appetite change.  HENT: Negative for congestion, mouth sores and sinus pressure.   Eyes: Negative for visual disturbance.  Respiratory: Negative for cough and chest tightness.   Gastrointestinal: Positive for abdominal pain (LLQ). Negative for nausea and blood in stool.  Genitourinary: Negative for frequency, difficulty urinating and vaginal pain.  Musculoskeletal: Negative for back pain and gait problem.  Skin: Negative for pallor and rash.  Neurological: Positive for weakness. Negative for dizziness, tremors, numbness and headaches.  Psychiatric/Behavioral: Negative for confusion and sleep disturbance.       Objective:   Physical Exam  Constitutional: She appears well-developed. No distress.       Thin   HENT:  Head: Normocephalic.  Right Ear: External ear normal.  Left Ear: External ear normal.  Nose: Nose normal.  Mouth/Throat: Oropharynx is clear and moist.  Eyes: Conjunctivae are normal. Pupils are equal, round, and reactive to light. Right eye exhibits no discharge. Left eye exhibits no discharge.  Neck: Normal range of motion. Neck supple. No JVD present. No tracheal deviation present. No thyromegaly present.  Cardiovascular: Normal rate, regular rhythm and normal heart sounds.    Pulmonary/Chest: No stridor. No respiratory distress. She has no wheezes.  Abdominal: Soft. Bowel sounds are normal. She exhibits no distension and no mass. There is tenderness (LLQ). There is no rebound and no guarding.  Musculoskeletal: She exhibits no edema and no tenderness.  Lymphadenopathy:    She has no cervical adenopathy.  Neurological: She displays normal reflexes. No cranial nerve deficit. She exhibits normal muscle tone. Coordination normal.  Skin: No rash noted. No erythema.  Psychiatric: She has a normal mood and affect. Her behavior is normal. Judgment and thought content normal.  decr hearing  Lab Results  Component Value Date   WBC 6.3 11/13/2011   HGB 13.6 11/13/2011   HCT 40.5 11/13/2011   PLT 212.0 11/13/2011   GLUCOSE 84 11/13/2011   CHOL 203* 11/13/2011   TRIG 137.0 11/13/2011   HDL 82.50 11/13/2011   LDLDIRECT 101.7 11/13/2011   ALT 11 01/03/2011   AST 25 01/03/2011   NA 142 11/13/2011   K 4.2 11/13/2011   CL 102 11/13/2011   CREATININE 1.0 01/13/2012   BUN 25* 01/13/2012   CO2 29 11/13/2011   TSH 1.88 11/13/2011   CT - constipation       Assessment & Plan:

## 2012-02-12 NOTE — Patient Instructions (Signed)
Hold Simvastatin Start Remeron in place of Zoloft -- take it at 5-6 pm

## 2012-02-17 ENCOUNTER — Telehealth: Payer: Self-pay | Admitting: *Deleted

## 2012-02-17 MED ORDER — MIRTAZAPINE 30 MG PO TBDP: 30.0000 mg | ORAL_TABLET | Freq: Every day | ORAL | Status: DC

## 2012-02-17 NOTE — Telephone Encounter (Signed)
This is a Very Unusual medication, but it really is true that the higher strength has LESS antihistamine effect at higher strength;  Ok to increase to 30 mg- done per emr;  to f/u any worsening symptoms or concerns

## 2012-02-17 NOTE — Telephone Encounter (Signed)
Pt's spouse calling stating Remeron solutab seems to be causing pt to be groggy and in a daze. Please advise what should she do?

## 2012-02-18 NOTE — Telephone Encounter (Signed)
Called and informed the patient of medication sent to pharmacy and MD's response. Patient stated she will call back if symptoms worsen.

## 2012-02-25 ENCOUNTER — Other Ambulatory Visit: Payer: Self-pay | Admitting: Internal Medicine

## 2012-03-08 ENCOUNTER — Other Ambulatory Visit: Payer: Self-pay | Admitting: Family Medicine

## 2012-03-12 ENCOUNTER — Telehealth: Payer: Self-pay | Admitting: Internal Medicine

## 2012-03-12 NOTE — Telephone Encounter (Signed)
Spouse is requesting Alprazalom prescription--[facility]--[phone]--Says pharm have sent fax several days ago--

## 2012-03-16 ENCOUNTER — Ambulatory Visit (INDEPENDENT_AMBULATORY_CARE_PROVIDER_SITE_OTHER): Payer: Medicare Other | Admitting: Internal Medicine

## 2012-03-16 ENCOUNTER — Encounter: Payer: Self-pay | Admitting: Internal Medicine

## 2012-03-16 VITALS — BP 140/86 | HR 94 | Resp 20 | Wt 92.2 lb

## 2012-03-16 DIAGNOSIS — R634 Abnormal weight loss: Secondary | ICD-10-CM

## 2012-03-16 DIAGNOSIS — K5900 Constipation, unspecified: Secondary | ICD-10-CM

## 2012-03-16 DIAGNOSIS — K219 Gastro-esophageal reflux disease without esophagitis: Secondary | ICD-10-CM

## 2012-03-16 DIAGNOSIS — F341 Dysthymic disorder: Secondary | ICD-10-CM

## 2012-03-16 DIAGNOSIS — G2581 Restless legs syndrome: Secondary | ICD-10-CM

## 2012-03-16 DIAGNOSIS — E039 Hypothyroidism, unspecified: Secondary | ICD-10-CM

## 2012-03-16 MED ORDER — ALPRAZOLAM 1 MG PO TABS: 1.0000 mg | ORAL_TABLET | Freq: Four times a day (QID) | ORAL | Status: DC | PRN

## 2012-03-16 MED ORDER — HYDROCODONE-HOMATROPINE 5-1.5 MG/5ML PO SYRP
5.0000 mL | ORAL_SOLUTION | Freq: Four times a day (QID) | ORAL | Status: DC | PRN
Start: 2012-03-16 — End: 2012-03-27

## 2012-03-16 NOTE — Assessment & Plan Note (Addendum)
   Take Zoloft 100 mg twice a day and Remeron 1/4 tab in PM x 4 d; Then Take Zoloft 100 mg twice a day and 1/2 tab Remeron in PM x 4 d; Then Take Zoloft 1/2 tab twice a day and Remeron 1 tab in PM x 4 d; Then  Take Zoloft 1/2 tab in am and Remeron 1 tab in pm continiously

## 2012-03-16 NOTE — Progress Notes (Signed)
Patient ID: Alexis Smith, female   DOB: [birth year], 78 y.o.   MRN: [id number] Patient ID: Alexis Smith, female   DOB: 17-Aug-1916, 78 y.o.   MRN: [id number]  Subjective:    Patient ID: Alexis Smith, female    DOB: May 09, 1916, 78 y.o.   MRN: [id number]  Leg Pain  Pertinent negatives include no numbness.     C/o LLQ abd pain and sweats, sweating at night and weakness x 3 mo -- better; wt loss -- not better. She took Remeron x 2 d -- too strong.Marland KitchenMarland KitchenMarland KitchenShe was on Zoloft 100 mg in am and 200 mg at hs prior She is willing to try Remeron again  Wt Readings from Last 3 Encounters:  03/16/12 92 lb 4 oz (41.844 kg)  02/12/12 97 lb 6.4 oz (44.18 kg)  01/13/12 97 lb (43.999 kg)   BP Readings from Last 3 Encounters:  03/16/12 140/86  02/12/12 150/90  01/13/12 130/90     Review of Systems  Constitutional: Positive for chills, diaphoresis, fatigue and unexpected weight change (lost 20 lbs). Negative for activity change and appetite change.  HENT: Negative for congestion, mouth sores and sinus pressure.   Eyes: Negative for visual disturbance.  Respiratory: Negative for cough and chest tightness.   Gastrointestinal: Positive for abdominal pain (LLQ). Negative for nausea and blood in stool.  Genitourinary: Negative for frequency, difficulty urinating and vaginal pain.  Musculoskeletal: Negative for back pain and gait problem.  Skin: Negative for pallor and rash.  Neurological: Positive for weakness. Negative for dizziness, tremors, numbness and headaches.  Psychiatric/Behavioral: Negative for confusion and sleep disturbance.       Objective:   Physical Exam  Constitutional: She appears well-developed. No distress.       Thin   HENT:  Head: Normocephalic.  Right Ear: External ear normal.  Left Ear: External ear normal.  Nose: Nose normal.  Mouth/Throat: Oropharynx is clear and moist.  Eyes: Conjunctivae are normal. Pupils are equal, round, and reactive to light. Right eye exhibits no discharge.  Left eye exhibits no discharge.  Neck: Normal range of motion. Neck supple. No JVD present. No tracheal deviation present. No thyromegaly present.  Cardiovascular: Normal rate, regular rhythm and normal heart sounds.   Pulmonary/Chest: No stridor. No respiratory distress. She has no wheezes.  Abdominal: Soft. Bowel sounds are normal. She exhibits no distension and no mass. There is tenderness (LLQ). There is no rebound and no guarding.  Musculoskeletal: She exhibits no edema and no tenderness.  Lymphadenopathy:    She has no cervical adenopathy.  Neurological: She displays normal reflexes. No cranial nerve deficit. She exhibits normal muscle tone. Coordination normal.  Skin: No rash noted. No erythema.  Psychiatric: She has a normal mood and affect. Her behavior is normal. Judgment and thought content normal.  decr hearing  Lab Results  Component Value Date   WBC 6.3 11/13/2011   HGB 13.6 11/13/2011   HCT 40.5 11/13/2011   PLT 212.0 11/13/2011   GLUCOSE 84 11/13/2011   CHOL 203* 11/13/2011   TRIG 137.0 11/13/2011   HDL 82.50 11/13/2011   LDLDIRECT 101.7 11/13/2011   ALT 11 01/03/2011   AST 25 01/03/2011   NA 142 11/13/2011   K 4.2 11/13/2011   CL 102 11/13/2011   CREATININE 1.0 01/13/2012   BUN 25* 01/13/2012   CO2 29 11/13/2011   TSH 1.88 11/13/2011   CT - constipation       Assessment & Plan:

## 2012-03-16 NOTE — Assessment & Plan Note (Signed)
2012 multifactorial Diet discussed

## 2012-03-16 NOTE — Patient Instructions (Signed)
Take Zoloft 100 mg twice a day and Remeron 1/4 tab in PM x 4 d; Then Take Zoloft 100 mg twice a day and 1/2 tab Remeron in PM x 4 d; Then Take Zoloft 1/2 tab twice a day and Remeron 1 tab in PM x 4 d; Then  Take Zoloft 1/2 tab in am and Remeron 1 tab in pm continiously

## 2012-03-16 NOTE — Assessment & Plan Note (Signed)
Continue with current prescription therapy as reflected on the Med list.  

## 2012-03-16 NOTE — Assessment & Plan Note (Signed)
Better  

## 2012-03-17 ENCOUNTER — Other Ambulatory Visit: Payer: Self-pay | Admitting: Internal Medicine

## 2012-03-18 NOTE — Telephone Encounter (Signed)
Ok to call in - I gave them a Rx yesterday, however Thx

## 2012-03-27 ENCOUNTER — Encounter: Payer: Self-pay | Admitting: Internal Medicine

## 2012-03-27 ENCOUNTER — Ambulatory Visit (INDEPENDENT_AMBULATORY_CARE_PROVIDER_SITE_OTHER): Payer: Medicare Other | Admitting: Internal Medicine

## 2012-03-27 ENCOUNTER — Other Ambulatory Visit: Payer: Self-pay | Admitting: Family Medicine

## 2012-03-27 VITALS — BP 112/80 | HR 84 | Temp 98.4°F | Resp 16 | Wt 93.8 lb

## 2012-03-27 DIAGNOSIS — R634 Abnormal weight loss: Secondary | ICD-10-CM

## 2012-03-27 DIAGNOSIS — F341 Dysthymic disorder: Secondary | ICD-10-CM

## 2012-03-27 DIAGNOSIS — K5900 Constipation, unspecified: Secondary | ICD-10-CM

## 2012-03-27 MED ORDER — LINACLOTIDE 145 MCG PO CAPS: 1.0000 | ORAL_CAPSULE | ORAL | Status: DC

## 2012-03-27 NOTE — Progress Notes (Signed)
Patient ID: Alexis Smith, female   DOB: February 24, 1916, 78 y.o.   MRN: [id number] Patient ID: Alexis Smith, female   DOB: 08-Aug-1916, 78 y.o.   MRN: [id number] Patient ID: Alexis Smith, female   DOB: 08/24/1916, 78 y.o.   MRN: [id number]  Subjective:    Patient ID: Alexis Smith, female    DOB: July 31, 1916, 78 y.o.   MRN: [id number]  HPI   F/u wt loss - better She is taking Remeron again F/u restless legs, legs feel cold  Wt Readings from Last 3 Encounters:  03/27/12 93 lb 12 oz (42.525 kg)  03/16/12 92 lb 4 oz (41.844 kg)  02/12/12 97 lb 6.4 oz (44.18 kg)   BP Readings from Last 3 Encounters:  03/27/12 112/80  03/16/12 140/86  02/12/12 150/90     Review of Systems  Constitutional: Positive for chills, diaphoresis, fatigue and unexpected weight change (lost 20 lbs). Negative for activity change and appetite change.  HENT: Negative for congestion, mouth sores and sinus pressure.   Eyes: Negative for visual disturbance.  Respiratory: Negative for cough and chest tightness.   Gastrointestinal: Positive for abdominal pain (LLQ). Negative for nausea and blood in stool.  Genitourinary: Negative for frequency, difficulty urinating and vaginal pain.  Musculoskeletal: Negative for back pain and gait problem.  Skin: Negative for pallor and rash.  Neurological: Positive for weakness. Negative for dizziness, tremors and headaches.  Psychiatric/Behavioral: Negative for confusion and sleep disturbance.       Objective:   Physical Exam  Constitutional: She appears well-developed. No distress.       Thin   HENT:  Head: Normocephalic.  Right Ear: External ear normal.  Left Ear: External ear normal.  Nose: Nose normal.  Mouth/Throat: Oropharynx is clear and moist.  Eyes: Conjunctivae are normal. Pupils are equal, round, and reactive to light. Right eye exhibits no discharge. Left eye exhibits no discharge.  Neck: Normal range of motion. Neck supple. No JVD present. No tracheal deviation present. No  thyromegaly present.  Cardiovascular: Normal rate, regular rhythm and normal heart sounds.   Pulmonary/Chest: No stridor. No respiratory distress. She has no wheezes.  Abdominal: Soft. Bowel sounds are normal. She exhibits no distension and no mass. There is tenderness (LLQ). There is no rebound and no guarding.  Musculoskeletal: She exhibits no edema and no tenderness.  Lymphadenopathy:    She has no cervical adenopathy.  Neurological: She displays normal reflexes. No cranial nerve deficit. She exhibits normal muscle tone. Coordination normal.  Skin: No rash noted. No erythema.  Psychiatric: She has a normal mood and affect. Her behavior is normal. Judgment and thought content normal.  decr hearing  Lab Results  Component Value Date   WBC 6.3 11/13/2011   HGB 13.6 11/13/2011   HCT 40.5 11/13/2011   PLT 212.0 11/13/2011   GLUCOSE 84 11/13/2011   CHOL 203* 11/13/2011   TRIG 137.0 11/13/2011   HDL 82.50 11/13/2011   LDLDIRECT 101.7 11/13/2011   ALT 11 01/03/2011   AST 25 01/03/2011   NA 142 11/13/2011   K 4.2 11/13/2011   CL 102 11/13/2011   CREATININE 1.0 01/13/2012   BUN 25* 01/13/2012   CO2 29 11/13/2011   TSH 1.88 11/13/2011   CT - constipation       Assessment & Plan:

## 2012-03-27 NOTE — Patient Instructions (Signed)
Use diabetic socks at night

## 2012-03-29 ENCOUNTER — Encounter: Payer: Self-pay | Admitting: Internal Medicine

## 2012-03-29 NOTE — Assessment & Plan Note (Signed)
Better Continue with current prescription therapy as reflected on the Med list.  

## 2012-03-29 NOTE — Assessment & Plan Note (Signed)
Will try Linzess

## 2012-03-29 NOTE — Assessment & Plan Note (Signed)
See meds 

## 2012-03-31 ENCOUNTER — Telehealth: Payer: Self-pay

## 2012-03-31 NOTE — Telephone Encounter (Signed)
Pt called c/o of pain under her LT breast. Pt says that pain began yesterday evening under her RT breast but eventually wnt away but she woke this morning with same pain under LT. Pt describes the pain as constant but dull and denies other sxs such as SOB, diaphoresis, numbness or weakness of extremities or face. Pt declined an appt and does not feel she needs to go to the ER but she said she would call the office this afternoon to give an update on sxs.

## 2012-03-31 NOTE — Telephone Encounter (Signed)
Mr. [name] called back.  He talked to the pharmacist.  He was told she may be in withdrawal from the Zoloft.  She is trying to taper off of Zoloft.  She is still taking 1/2 pill of Zoloft per day.  She takes 3 Xanex per day.  She has taken a whole tablet of Remeron for the last 3 days. I told her about taking the 325 mg Aspirin.

## 2012-03-31 NOTE — Telephone Encounter (Signed)
I'm aware. Cont w/current plan. Thx

## 2012-03-31 NOTE — Telephone Encounter (Signed)
Take 325 mg ASA  Thx

## 2012-04-01 ENCOUNTER — Other Ambulatory Visit: Payer: Self-pay | Admitting: Internal Medicine

## 2012-04-01 NOTE — Telephone Encounter (Signed)
HUSBAND IS AWARE.  REMINDED HIM OF HER APPT. TUES April 23.

## 2012-04-07 ENCOUNTER — Ambulatory Visit (INDEPENDENT_AMBULATORY_CARE_PROVIDER_SITE_OTHER): Payer: Medicare Other | Admitting: Internal Medicine

## 2012-04-07 ENCOUNTER — Encounter: Payer: Self-pay | Admitting: Internal Medicine

## 2012-04-07 ENCOUNTER — Other Ambulatory Visit (INDEPENDENT_AMBULATORY_CARE_PROVIDER_SITE_OTHER): Payer: Medicare Other

## 2012-04-07 VITALS — BP 140/80 | HR 88 | Temp 96.6°F | Resp 16 | Wt 91.5 lb

## 2012-04-07 DIAGNOSIS — R17 Unspecified jaundice: Secondary | ICD-10-CM

## 2012-04-07 DIAGNOSIS — R634 Abnormal weight loss: Secondary | ICD-10-CM

## 2012-04-07 DIAGNOSIS — F341 Dysthymic disorder: Secondary | ICD-10-CM

## 2012-04-07 LAB — BASIC METABOLIC PANEL
CO2: 20 mEq/L (ref 19–32)
Calcium: 9.5 mg/dL (ref 8.4–10.5)
GFR: 67.15 mL/min (ref >60.00)
Sodium: 140 mEq/L (ref 135–145)

## 2012-04-07 LAB — ALBUMIN: Albumin: 3.7 g/dL (ref 3.5–5.2)

## 2012-04-07 LAB — HEPATIC FUNCTION PANEL
Alkaline Phosphatase: 1929 U/L — ABNORMAL HIGH (ref 39–117)
Bilirubin, Direct: 8.4 mg/dL — ABNORMAL HIGH (ref 0.0–0.3)

## 2012-04-07 LAB — AMMONIA: Ammonia: 124 umol/L — ABNORMAL HIGH (ref 11–35)

## 2012-04-07 MED ORDER — SERTRALINE HCL 100 MG PO TABS: 50.0000 mg | ORAL_TABLET | Freq: Every day | ORAL | Status: DC

## 2012-04-07 NOTE — Assessment & Plan Note (Addendum)
Acute 4/13 ?due to Remeron vs other. She has been on Fioricet 4 tab/d. Abd CT was OK Stop Remeron Cut back on Fioricet to bid  If cholestatic - will send her to the hospital for GI eval and a poss ERCP

## 2012-04-07 NOTE — Progress Notes (Signed)
Patient ID: KARRISA DIDIO, female   DOB: Oct 24, 1916, 78 y.o.   MRN: [id number] Patient ID: ARYN SAFRAN, female   DOB: 1916/09/01, 78 y.o.   MRN: [id number] Patient ID: RHYLI DEPAULA, female   DOB: [birth year], 78 y.o.   MRN: [id number] Patient ID: DERINDA BARTUS, female   DOB: June 22, 1916, 78 y.o.   MRN: [id number]  Subjective:    Patient ID: Alexis Smith, female    DOB: 1916-08-31, 78 y.o.   MRN: [id number]  HPI  C/o dark urine F/u wt loss  She is taking Remeron again - 1 tab/d started 2-3 d ago F/u restless legs, legs feel cold  Wt Readings from Last 3 Encounters:  04/07/12 91 lb 8 oz (41.504 kg)  03/27/12 93 lb 12 oz (42.525 kg)  03/16/12 92 lb 4 oz (41.844 kg)   BP Readings from Last 3 Encounters:  04/07/12 140/80  03/27/12 112/80  03/16/12 140/86     Review of Systems  Constitutional: Positive for chills, diaphoresis, fatigue and unexpected weight change (lost 20 lbs). Negative for activity change and appetite change.  HENT: Negative for congestion, mouth sores and sinus pressure.   Eyes: Negative for visual disturbance.  Respiratory: Negative for cough and chest tightness.   Gastrointestinal: Positive for abdominal pain (LLQ). Negative for nausea and blood in stool.  Genitourinary: Negative for frequency, difficulty urinating and vaginal pain.  Musculoskeletal: Negative for back pain and gait problem.  Skin: Negative for pallor and rash.  Neurological: Positive for weakness. Negative for dizziness, tremors and headaches.  Psychiatric/Behavioral: Negative for confusion and sleep disturbance.       Objective:   Physical Exam  Constitutional: She appears well-developed. No distress.       Thin   HENT:  Head: Normocephalic.  Right Ear: External ear normal.  Left Ear: External ear normal.  Nose: Nose normal.  Mouth/Throat: Oropharynx is clear and moist.  Eyes: Conjunctivae are normal. Pupils are equal, round, and reactive to light. Right eye exhibits no discharge. Left eye exhibits  no discharge.  Neck: Normal range of motion. Neck supple. No JVD present. No tracheal deviation present. No thyromegaly present.  Cardiovascular: Normal rate, regular rhythm and normal heart sounds.   Pulmonary/Chest: No stridor. No respiratory distress. She has no wheezes.  Abdominal: Soft. Bowel sounds are normal. She exhibits no distension and no mass. There is tenderness (LLQ). There is no rebound and no guarding.  Musculoskeletal: She exhibits no edema and no tenderness.  Lymphadenopathy:    She has no cervical adenopathy.  Neurological: She displays normal reflexes. No cranial nerve deficit. She exhibits normal muscle tone. Coordination normal.  Skin: No rash noted. No erythema.       Jaundiced   Psychiatric: She has a normal mood and affect. Her behavior is normal. Judgment and thought content normal.  decr hearing  Lab Results  Component Value Date   WBC 6.3 11/13/2011   HGB 13.6 11/13/2011   HCT 40.5 11/13/2011   PLT 212.0 11/13/2011   GLUCOSE 84 11/13/2011   CHOL 203* 11/13/2011   TRIG 137.0 11/13/2011   HDL 82.50 11/13/2011   LDLDIRECT 101.7 11/13/2011   ALT 11 01/03/2011   AST 25 01/03/2011   NA 142 11/13/2011   K 4.2 11/13/2011   CL 102 11/13/2011   CREATININE 1.0 01/13/2012   BUN 25* 01/13/2012   CO2 29 11/13/2011   TSH 1.88 11/13/2011   CT - constipation       Assessment &  Plan:

## 2012-04-07 NOTE — Patient Instructions (Addendum)
Stop Remeron (Mirtazapine) today, please! Cut back on Butalbital/APAP

## 2012-04-08 ENCOUNTER — Encounter: Payer: Self-pay | Admitting: Internal Medicine

## 2012-04-08 ENCOUNTER — Telehealth: Payer: Self-pay | Admitting: Internal Medicine

## 2012-04-08 ENCOUNTER — Encounter: Payer: Self-pay | Admitting: Nurse Practitioner

## 2012-04-08 ENCOUNTER — Ambulatory Visit (INDEPENDENT_AMBULATORY_CARE_PROVIDER_SITE_OTHER): Payer: Medicare Other | Admitting: Nurse Practitioner

## 2012-04-08 VITALS — BP 144/76 | HR 80 | Ht 60.0 in | Wt 91.6 lb

## 2012-04-08 DIAGNOSIS — R634 Abnormal weight loss: Secondary | ICD-10-CM

## 2012-04-08 DIAGNOSIS — R7989 Other specified abnormal findings of blood chemistry: Secondary | ICD-10-CM

## 2012-04-08 DIAGNOSIS — R198 Other specified symptoms and signs involving the digestive system and abdomen: Secondary | ICD-10-CM | POA: Insufficient documentation

## 2012-04-08 NOTE — Assessment & Plan Note (Signed)
Hold Remeron Cont Zoloft

## 2012-04-08 NOTE — Patient Instructions (Signed)
We scheduled the Ct scan at [facility] building, 3rd floor.  You have been scheduled for a CT scan of the abdomen and pelvis at [facility] ([address]---this is in the same building as Architectural technologist).   You are scheduled on 04-10-2012 at 10:30 Am. You should arrive at 10:15 AM to your appointment time for registration. Please follow the written instructions below on the day of your exam:  WARNING: IF YOU ARE ALLERGIC TO IODINE/X-RAY DYE, PLEASE NOTIFY RADIOLOGY IMMEDIATELY AT [phone]! YOU WILL BE GIVEN A 13 HOUR PREMEDICATION PREP.  1) Do not eat or drink anything after 6;30 AM (4 hours prior to your test) 2) You have been given 2 bottles of oral contrast to drink. The solution may taste better if refrigerated, but do NOT add ice or any other liquid to this solution. Shake well before drinking.    Drink 1 bottle of contrast @ 8:30 Am  (2 hours prior to your exam)  Drink 1 bottle of contrast @ 9:30 AM (1 hour prior to your exam)  You may take any medications as prescribed with a small amount of water except for the following: Metformin, Glucophage, Glucovance, Avandamet, Riomet, Fortamet, Actoplus Met, Janumet, Glumetza or Metaglip. The above medications must be held the day of the exam AND 48 hours after the exam.  The purpose of you drinking the oral contrast is to aid in the visualization of your intestinal tract. The contrast solution may cause some diarrhea. Before your exam is started, you will be given a small amount of fluid to drink. Depending on your individual set of symptoms, you may also receive an intravenous injection of x-ray contrast/dye. Plan on being at [facility] for 30 minutes or long, depending on the type of exam you are having performed.  If you have any questions regarding your exam or if you need to reschedule, you may call the CT department at [phone] between the hours of 8:00 am and 5:00 pm,  Monday-Friday.  ________________________________________________________________________

## 2012-04-08 NOTE — Telephone Encounter (Signed)
GI cons today. She refused hosp adm

## 2012-04-08 NOTE — Telephone Encounter (Signed)
Disregard per Dr. Posey Rea.

## 2012-04-08 NOTE — Assessment & Plan Note (Signed)
2012 multifactorial, refractory Now she is jaundiced - will re-visit

## 2012-04-08 NOTE — Telephone Encounter (Signed)
Called - no reply Misty Stanley, please, get me Mr Massar on the phone later today Thx

## 2012-04-08 NOTE — Progress Notes (Signed)
04/08/2012 Alexis Smith [id number] [birth year]   HISTORY OF PRESENT ILLNESS: Patient is a 78 year old female who had a colonoscopy in 2001 with Dr. Corinda Gubler for abdominal pain and bowel habit changes. Findings included only diverticulosis. She was last seen by Dr. Juanda Chance in July 2012. At that time she was having constipation felt to be due to a rectocele. She is now referred here for evaluation of abnormal liver function studies. Patient is here with her husband. She reports about a 20 pound weight loss recently.  Her urine is dark. Labs yesterday pertinent for total bilirubin 12.1, direct bilirubin 8.4, alkaline phosphatase 1929, AST 247, ALT 126, BUN mildly elevated at 28, normal creatinine at 0.8, GFR normal at 67.1. Ammonia 124. No significant complaints. She has occasional lower abdominal discomfort associated with defecation. She has occasional chest discomfort but not recently. She takes Linzess as needed for constipation.    Past Medical History  Diagnosis Date  . Arthritis   . FHx: allergies   . Thyroid condition   . Diverticulitis   . Anemia   . Anxiety   . Depression   . History of orthostatic hypotension   . Restless leg syndrome   . Peripheral neuropathy   . Insomnia   . Osteopenia   . GERD (gastroesophageal reflux disease)   . Hemorrhoidal skin tag   . Hyperlipidemia   . Hypothyroidism   . Diverticulosis   . Hemorrhoids   . Chronic urethritis   . Weight loss    Past Surgical History  Procedure Date  . Abdominal hysterectomy 1960  . Breast lumpectomy     left  . Hand surgery   . Hemorrhoid surgery     reports that she has never smoked. She has never used smokeless tobacco. She reports that she does not drink alcohol or use illicit drugs. family history includes Cancer in her brother; Colon cancer in her brother; Dementia in her sister; and Stroke in her father. Allergies  Allergen Reactions  . Codeine Phosphate     REACTION: unspecified  . Tetanus Toxoid Adsorbed      REACTION: unspecified      Outpatient Encounter Prescriptions as of 04/08/2012  Medication Sig Dispense Refill  . ALPRAZolam (XANAX) 1 MG tablet Take 1 tablet (1 mg total) by mouth 4 (four) times daily as needed for sleep.  120 tablet  3  . butalbital-acetaminophen-caffeine (FIORICET) 50-325-40 MG per tablet Take 2 tablets by mouth every 6 (six) hours as needed for headache.  100 tablet  5  . clotrimazole-betamethasone (LOTRISONE) cream Apply 1 application topically 2 (two) times daily.        . Linaclotide (LINZESS) 145 MCG CAPS Take 1 capsule by mouth 1 day or 1 dose.  30 capsule  11  . NORVASC 5 MG tablet TAKE 1 TABLET EACH DAY.  30 each  5  . omeprazole (PRILOSEC) 20 MG capsule Take 1 capsule (20 mg total) by mouth daily.  30 capsule  11  . pramoxine-hydrocortisone (ANALPRAM HC) cream Apply topically 2 (two) times daily.        Marland Kitchen PRILOSEC OTC 20 MG tablet TAKE 1 TABLET TWICE DAILY.  180 each  1  . REQUIP 5 MG tablet TAKE ONE TABLET AT BEDTIME.  30 each  3  . sertraline (ZOLOFT) 100 MG tablet Take 0.5 tablets (50 mg total) by mouth daily.  30 tablet  11  . SYNTHROID 50 MCG tablet TAKE 1 TABLET ONCE DAILY.  30 each  11  REVIEW OF SYSTEMS  : All other systems reviewed and negative except where noted in the History of Present Illness.   PHYSICAL EXAM: BP 144/76  Pulse 80  Ht 5' (1.524 m)  Wt 91 lb 9.6 oz (41.549 kg)  BMI 17.89 kg/m2 General:  Well dressed frail white female in no acute distress Head: Normocephalic and atraumatic Eyes:  sclerae icteric Ears: Mildly decreased auditory acuity Mouth: No deformity or lesions Neck: Supple, no masses.  Lungs: Clear throughout to auscultation Heart: Regular rate and rhythm Abdomen: Soft, non distended, nontender. No masses or hepatomegaly noted. Normal Bowel sounds Rectal: not done Musculoskeletal: Symmetrical with no gross deformities  Skin: No lesions on visible extremities. jaundice Extremities: No edema or deformities  noted Neurological: Alert oriented, grossly nonfocal Cervical Nodes:  No significant cervical adenopathy Psychological:  Alert and cooperative. Normal mood and affect  ASSESSMENT AND PLAN;  1. Markedly abnormal LFTs and significant weight loss concerning for underlying malignancy. She hasn't had any abdominal pain ( at least not recently) making her painless jaundice more concerning. A noncontrast CTscan done in January of this year showed only a large hiatal hernia and large amount of colonic stool Patient needs repeat imaging. Her BUN is just minimally elevated at 28, creatinine normal at 0.8. Her GFR is normal. Will obtain a CT scan of the abdomen and pelvis with contrast. Recommendations based on results.  2. Chronic constipation, taking Linzess as needed. 3. Weight loss, see #1.

## 2012-04-10 ENCOUNTER — Telehealth: Payer: Self-pay | Admitting: Nurse Practitioner

## 2012-04-10 ENCOUNTER — Ambulatory Visit (INDEPENDENT_AMBULATORY_CARE_PROVIDER_SITE_OTHER)
Admission: RE | Admit: 2012-04-10 | Discharge: 2012-04-10 | Disposition: A | Discharge status: Home / Self Care | Payer: Medicare Other | Source: Ambulatory Visit | Attending: Nurse Practitioner | Admitting: Nurse Practitioner

## 2012-04-10 ENCOUNTER — Telehealth: Payer: Self-pay | Admitting: Internal Medicine

## 2012-04-10 DIAGNOSIS — R198 Other specified symptoms and signs involving the digestive system and abdomen: Secondary | ICD-10-CM

## 2012-04-10 DIAGNOSIS — R7989 Other specified abnormal findings of blood chemistry: Secondary | ICD-10-CM

## 2012-04-10 DIAGNOSIS — R634 Abnormal weight loss: Secondary | ICD-10-CM

## 2012-04-10 MED ORDER — IOHEXOL 300 MG/ML  SOLN
80.0000 mL | Freq: Once | INTRAMUSCULAR | Status: AC | PRN
  Administered 2012-04-10: 80 mL via INTRAVENOUS

## 2012-04-10 NOTE — Progress Notes (Signed)
I agree with assessment and plan.

## 2012-04-10 NOTE — Telephone Encounter (Signed)
Spoke with patient's husband(patient was resting in bed) and gave him Dr Lauro Franklin recommendations. He understands to take her  to ER for chills, fever, abdominal pain , severe nausea or vomiting should this occur before ERCP performed. He understands we will be calling Monday AM with appointment.

## 2012-04-10 NOTE — Telephone Encounter (Signed)
PT GOT A CALL TODAY FROM [name] IN GI ABOUT THE CT SHE HAD TO DAY.  SHE MAY NEED A PROCEDURE ON Monday. MR. Alexis Smith WANTS DR. PLOTNIKOV TO BE AWARE OF WHAT'S GOING ON.  THEY WANT A CALL FROM DR. PLOTNIKOV BEFORE PROCEEDING.

## 2012-04-10 NOTE — Telephone Encounter (Signed)
Imaging reviewed as Dr. of the day CT shows intra-and extrahepatic biliary ductal dilation, but no definite mass lesion. Patient with markedly abnormal liver function tests, in a cholestatic pattern. All this is concerning for biliary obstruction, possibly from underlying malignancy Pt needs ERCP. she should be instructed to report to the ER for any fever, chills, abdominal pain, severe nausea or vomiting, should this occur before ERCP can be performed

## 2012-04-10 NOTE — Telephone Encounter (Signed)
Patient calling for CT results done today. Willette Cluster, NP off and Dr Juanda Chance off. DOD- Dr. Rhea Belton. Dr Rhea Belton, Please, advise me what to tell this patient.

## 2012-04-10 NOTE — Telephone Encounter (Signed)
I called x 2 - busy signal. I agree with the procedure planned Thx

## 2012-04-12 NOTE — Telephone Encounter (Signed)
Dr Jarold Motto was the DOD on the day of her appointm with [facility]. I have never seen the pt. But I will be the hosp doctor next week if she needs any help.

## 2012-04-13 ENCOUNTER — Other Ambulatory Visit: Payer: Self-pay | Admitting: Gastroenterology

## 2012-04-13 DIAGNOSIS — K8020 Calculus of gallbladder without cholecystitis without obstruction: Secondary | ICD-10-CM

## 2012-04-13 NOTE — Telephone Encounter (Signed)
Called number listed above- no answer/machine,

## 2012-04-13 NOTE — Telephone Encounter (Signed)
Spoke with pts husband about the ERCP appt. Pts husband states that he wants to wait until after their appt with Dr. Posey Rea before they proceed with ERCP. Message left for Stacy, Dr. Loren Racer CMA, regarding patient.  Alexis Smith called back for prep instructions and appt date and time for ERCP. He is aware of appt date and time.

## 2012-04-13 NOTE — Telephone Encounter (Signed)
Pt needs ERCP with [facility], this week.  Please premedicate with cipro 400mg IV

## 2012-04-13 NOTE — Telephone Encounter (Signed)
Orders placed in Epic. Scheduler to check with the OR and call back.

## 2012-04-13 NOTE — Telephone Encounter (Signed)
Pt scheduled for ERCP at [facility] 04/16/12 arrival time 12noon for a 1pm ERCP. Pt to have clear liquids after midnight up until 9am. Message left for pt to call back.

## 2012-04-14 ENCOUNTER — Encounter: Payer: Self-pay | Admitting: Internal Medicine

## 2012-04-14 ENCOUNTER — Telehealth: Payer: Self-pay | Admitting: Internal Medicine

## 2012-04-14 ENCOUNTER — Ambulatory Visit: Payer: Medicare Other | Admitting: Internal Medicine

## 2012-04-14 ENCOUNTER — Other Ambulatory Visit (INDEPENDENT_AMBULATORY_CARE_PROVIDER_SITE_OTHER): Payer: Medicare Other

## 2012-04-14 ENCOUNTER — Ambulatory Visit (INDEPENDENT_AMBULATORY_CARE_PROVIDER_SITE_OTHER): Payer: Medicare Other | Admitting: Internal Medicine

## 2012-04-14 VITALS — BP 138/80 | HR 80 | Resp 16 | Wt 92.0 lb

## 2012-04-14 DIAGNOSIS — Z8679 Personal history of other diseases of the circulatory system: Secondary | ICD-10-CM

## 2012-04-14 DIAGNOSIS — R17 Unspecified jaundice: Secondary | ICD-10-CM

## 2012-04-14 DIAGNOSIS — G4700 Insomnia, unspecified: Secondary | ICD-10-CM

## 2012-04-14 DIAGNOSIS — R634 Abnormal weight loss: Secondary | ICD-10-CM

## 2012-04-14 LAB — BASIC METABOLIC PANEL
BUN: 30 mg/dL — ABNORMAL HIGH (ref 6–23)
Creatinine, Ser: 0.9 mg/dL (ref 0.4–1.2)
GFR: 60.45 mL/min (ref >60.00)

## 2012-04-14 LAB — PROTIME-INR
INR: 1 ratio (ref 0.8–1.0)
Prothrombin Time: 10.7 s (ref 10.2–12.4)

## 2012-04-14 LAB — AMMONIA: Ammonia: 248 umol/L — ABNORMAL HIGH (ref 11–35)

## 2012-04-14 LAB — HEPATIC FUNCTION PANEL: Total Bilirubin: 4 mg/dL — ABNORMAL HIGH (ref 0.3–1.2)

## 2012-04-14 MED ORDER — AMLODIPINE BESYLATE 5 MG PO TABS: 2.5000 mg | ORAL_TABLET | Freq: Every day | ORAL | Status: DC

## 2012-04-14 MED ORDER — HYDROXYZINE HCL 10 MG PO TABS: ORAL_TABLET | ORAL | Status: DC

## 2012-04-14 NOTE — Progress Notes (Signed)
Patient ID: Alexis Smith, female   DOB: Mar 22, 1916, 78 y.o.   MRN: [id number] Patient ID: Alexis Smith, female   DOB: 11-02-1916, 78 y.o.   MRN: [id number] Patient ID: Alexis Smith, female   DOB: 1916/09/02, 78 y.o.   MRN: [id number] Patient ID: Alexis Smith, female   DOB: 05/17/1916, 78 y.o.   MRN: [id number] Patient ID: Alexis Smith, female   DOB: [birth year], 78 y.o.   MRN: [id number]  Subjective:    Patient ID: Alexis Smith, female    DOB: 08-22-1916, 78 y.o.   MRN: [id number]  HPI   F/u jaundice and wt loss  She is to have ERCP  She stopped Remeron again F/u restless legs, legs feel cold, insomnia  Wt Readings from Last 3 Encounters:  04/14/12 92 lb (41.731 kg)  04/08/12 91 lb 9.6 oz (41.549 kg)  04/07/12 91 lb 8 oz (41.504 kg)   BP Readings from Last 3 Encounters:  04/14/12 138/80  04/08/12 144/76  04/07/12 140/80     Review of Systems  Constitutional: Positive for chills, diaphoresis, fatigue and unexpected weight change (lost 20 lbs). Negative for activity change and appetite change.  HENT: Negative for congestion, mouth sores and sinus pressure.   Eyes: Negative for visual disturbance.  Respiratory: Negative for cough and chest tightness.   Gastrointestinal: Positive for abdominal pain (LLQ). Negative for nausea and blood in stool.  Genitourinary: Negative for frequency, difficulty urinating and vaginal pain.  Musculoskeletal: Negative for back pain and gait problem.  Skin: Negative for pallor and rash.  Neurological: Positive for weakness. Negative for dizziness, tremors and headaches.  Psychiatric/Behavioral: Negative for confusion and sleep disturbance.       Objective:   Physical Exam  Constitutional: She appears well-developed. No distress.       Thin   HENT:  Head: Normocephalic.  Right Ear: External ear normal.  Left Ear: External ear normal.  Nose: Nose normal.  Mouth/Throat: Oropharynx is clear and moist.  Eyes: Conjunctivae are normal. Pupils are equal, round,  and reactive to light. Right eye exhibits no discharge. Left eye exhibits no discharge.  Neck: Normal range of motion. Neck supple. No JVD present. No tracheal deviation present. No thyromegaly present.  Cardiovascular: Normal rate, regular rhythm and normal heart sounds.   Pulmonary/Chest: No stridor. No respiratory distress. She has no wheezes.  Abdominal: Soft. Bowel sounds are normal. She exhibits no distension and no mass. There is tenderness (LLQ). There is no rebound and no guarding.  Musculoskeletal: She exhibits no edema and no tenderness.  Lymphadenopathy:    She has no cervical adenopathy.  Neurological: She displays normal reflexes. No cranial nerve deficit. She exhibits normal muscle tone. Coordination normal.  Skin: No rash noted. No erythema.       Jaundiced less today  Psychiatric: She has a normal mood and affect. Her behavior is normal. Judgment and thought content normal.  decr hearing  Lab Results  Component Value Date   WBC 6.3 11/13/2011   HGB 13.6 11/13/2011   HCT 40.5 11/13/2011   PLT 212.0 11/13/2011   GLUCOSE 125* 04/07/2012   CHOL 203* 11/13/2011   TRIG 137.0 11/13/2011   HDL 82.50 11/13/2011   LDLDIRECT 101.7 11/13/2011   ALT 126* 04/07/2012   AST 247* 04/07/2012   NA 140 04/07/2012   K 3.2* 04/07/2012   CL 103 04/07/2012   CREATININE 0.8 04/07/2012   BUN 28* 04/07/2012   CO2 20 04/07/2012   TSH 1.88  11/13/2011   INR 1.3* 04/07/2012   CT w/o contrast - constipation       Assessment & Plan:

## 2012-04-14 NOTE — Assessment & Plan Note (Signed)
Stable

## 2012-04-14 NOTE — Telephone Encounter (Signed)
Alexis Smith, please, inform patient that all liver labs are better - good news!-she has elev potassium: do not eat any bananas; do not take any potassium tabs. Drink more water! Thx

## 2012-04-14 NOTE — Assessment & Plan Note (Addendum)
Clinically better off Remeron Labs  ERCP on Wed this week - Dr Arlyce Dice GI help is very appreciated!

## 2012-04-14 NOTE — Assessment & Plan Note (Signed)
Hold Amlodipine or take 1/2

## 2012-04-14 NOTE — Assessment & Plan Note (Signed)
Will try Hydroxyzine w/ caution

## 2012-04-14 NOTE — Telephone Encounter (Signed)
noted 

## 2012-04-15 NOTE — Telephone Encounter (Signed)
No answer/machine,

## 2012-04-15 NOTE — Telephone Encounter (Signed)
Pt's husband informed.

## 2012-04-16 ENCOUNTER — Ambulatory Visit (HOSPITAL_COMMUNITY): Payer: Medicare Other | Admitting: Anesthesiology

## 2012-04-16 ENCOUNTER — Encounter (HOSPITAL_COMMUNITY)
Admission: RE | Disposition: A | Discharge status: Home / Self Care | Payer: Self-pay | Source: Ambulatory Visit | Attending: Gastroenterology

## 2012-04-16 ENCOUNTER — Encounter (HOSPITAL_COMMUNITY): Payer: Self-pay | Admitting: Anesthesiology

## 2012-04-16 ENCOUNTER — Encounter (HOSPITAL_COMMUNITY): Payer: Self-pay | Admitting: *Deleted

## 2012-04-16 ENCOUNTER — Other Ambulatory Visit: Payer: Self-pay | Admitting: Gastroenterology

## 2012-04-16 ENCOUNTER — Ambulatory Visit (HOSPITAL_COMMUNITY)
Admission: RE | Admit: 2012-04-16 | Discharge: 2012-04-16 | Disposition: A | Discharge status: Home / Self Care | Payer: Medicare Other | Source: Ambulatory Visit | Attending: Gastroenterology | Admitting: Gastroenterology

## 2012-04-16 ENCOUNTER — Ambulatory Visit (HOSPITAL_COMMUNITY): Payer: Medicare Other

## 2012-04-16 DIAGNOSIS — K219 Gastro-esophageal reflux disease without esophagitis: Secondary | ICD-10-CM | POA: Insufficient documentation

## 2012-04-16 DIAGNOSIS — E785 Hyperlipidemia, unspecified: Secondary | ICD-10-CM | POA: Insufficient documentation

## 2012-04-16 DIAGNOSIS — R7989 Other specified abnormal findings of blood chemistry: Secondary | ICD-10-CM | POA: Insufficient documentation

## 2012-04-16 DIAGNOSIS — E039 Hypothyroidism, unspecified: Secondary | ICD-10-CM | POA: Insufficient documentation

## 2012-04-16 DIAGNOSIS — Z79899 Other long term (current) drug therapy: Secondary | ICD-10-CM | POA: Insufficient documentation

## 2012-04-16 DIAGNOSIS — K8020 Calculus of gallbladder without cholecystitis without obstruction: Secondary | ICD-10-CM

## 2012-04-16 DIAGNOSIS — K831 Obstruction of bile duct: Secondary | ICD-10-CM | POA: Insufficient documentation

## 2012-04-16 DIAGNOSIS — R17 Unspecified jaundice: Secondary | ICD-10-CM

## 2012-04-16 DIAGNOSIS — K5900 Constipation, unspecified: Secondary | ICD-10-CM | POA: Insufficient documentation

## 2012-04-16 DIAGNOSIS — R634 Abnormal weight loss: Secondary | ICD-10-CM | POA: Insufficient documentation

## 2012-04-16 HISTORY — PX: ERCP: SHX5425

## 2012-04-16 SURGERY — ERCP, WITH INTERVENTION IF INDICATED
Anesthesia: Monitor Anesthesia Care

## 2012-04-16 SURGERY — ENDOSCOPIC RETROGRADE CHOLANGIOPANCREATOGRAPHY (ERCP)
Anesthesia: Monitor Anesthesia Care

## 2012-04-16 MED ORDER — KETAMINE HCL 10 MG/ML IJ SOLN
INTRAMUSCULAR | Status: DC | PRN
  Administered 2012-04-16 (×4): 10 mg via INTRAVENOUS

## 2012-04-16 MED ORDER — LACTATED RINGERS IV SOLN
INTRAVENOUS | Status: DC
  Administered 2012-04-16: 1000 mL via INTRAVENOUS

## 2012-04-16 MED ORDER — CIPROFLOXACIN IN D5W 400 MG/200ML IV SOLN
INTRAVENOUS | Status: AC
  Filled 2012-04-16: qty 200

## 2012-04-16 MED ORDER — SODIUM CHLORIDE 0.9 % IV SOLN
INTRAVENOUS | Status: DC | PRN
  Administered 2012-04-16: 14:00:00

## 2012-04-16 MED ORDER — GLYCOPYRROLATE 0.2 MG/ML IJ SOLN
INTRAMUSCULAR | Status: DC | PRN
  Administered 2012-04-16: 0.2 mg via INTRAVENOUS

## 2012-04-16 MED ORDER — GLUCAGON HCL (RDNA) 1 MG IJ SOLR
INTRAMUSCULAR | Status: AC
  Filled 2012-04-16: qty 2

## 2012-04-16 MED ORDER — FENTANYL CITRATE 0.05 MG/ML IJ SOLN
INTRAMUSCULAR | Status: DC | PRN
  Administered 2012-04-16 (×2): 50 ug via INTRAVENOUS

## 2012-04-16 MED ORDER — CIPROFLOXACIN IN D5W 400 MG/200ML IV SOLN
400.0000 mg | Freq: Once | INTRAVENOUS | Status: AC
  Administered 2012-04-16: 400 mg via INTRAVENOUS
  Filled 2012-04-16: qty 200

## 2012-04-16 MED ORDER — PROPOFOL 10 MG/ML IV EMUL
INTRAVENOUS | Status: DC | PRN
  Administered 2012-04-16: 100 ug/kg/min via INTRAVENOUS

## 2012-04-16 NOTE — Interval H&P Note (Signed)
History and Physical Interval Note:  04/16/2012 1:14 PM  Alexis Smith  has presented today for surgery, with the diagnosis of gallstones  The various methods of treatment have been discussed with the patient and family. After consideration of risks, benefits and other options for treatment, the patient has consented to  Procedure(s) (LRB): ENDOSCOPIC RETROGRADE CHOLANGIOPANCREATOGRAPHY (ERCP) (N/A) as a surgical intervention .  The patients' history has been reviewed, patient examined, no change in status, stable for surgery.  I have reviewed the patients' chart and labs.  Questions were answered to the patient's satisfaction.    The recent H&P (dated *04/08/12**) was reviewed, the patient was examined and there is no change in the patients condition since that H&P was completed.   [name]  04/16/2012, 1:14 PM    [name]

## 2012-04-16 NOTE — Op Note (Signed)
[facility] [address] [place], [place]  [address]  ERCP PROCEDURE REPORT  PATIENT:  Alexis Smith, Alexis Smith  MR#:  [id number] BIRTHDATE:  [birth year]  GENDER:  female  ENDOSCOPIST:  Barbette Hair. Arlyce Dice, MD ASSISTANT:  Rolanda Lundborg, Claudie Revering, RN CGRN  PROCEDURE DATE:  04/16/2012 PROCEDURE:  ERCP with sphincterotomy, ERCP with stent placement, ERCP w/stent removal  INDICATIONS:  stricture  MEDICATIONS:   MAC sedation, administered by CRNA, cipro 40 mg IV TOPICAL ANESTHETIC:  DESCRIPTION OF PROCEDURE:   After the risks benefits and alternatives of the procedure were thoroughly explained, informed consent was obtained.  The  endoscope was introduced through the mouth and advanced to the second portion of the duodenum.  The pancreatic duct was successfully cannulated and filled with contrast. Care was taken not to overfill the duct. Normal pancreatic duct. Because 0.24mm guidewire kept falling into the pancreatic duct a 5Fr pigtail pancreatic stent was temporarily placed. Following this the CBD was cannulated with a 0.73mm wire. Injection demonstrated a high grade stricture measuring at least 3cm in the distal CBD. Cytology was taken. A 10Fr 8cm uncovered biliary metal stent was placed across the stricture above the proximal end and into the duodenum. Pancreatic stent was then removed (see image1, image2, and image4).    The scope was then completely withdrawn from the patient and the procedure terminated. <<PROCEDUREIMAGES>>  COMPLICATIONS:  None  ENDOSCOPIC IMPRESSION:Malignant biliary stricture - s/p placement of uncovered metal biliary stent  RECOMMENDATIONS:Check cytology f/u LFTs in 1 week  ______________________________ Barbette Hair. Arlyce Dice, MD  cc Dr. Erick Blinks  CC:  n. eSIGNED:   Barbette Hair. Jesiah Yerby at 04/16/2012 02:33 PM  Gaetano Net, [phone]

## 2012-04-16 NOTE — Discharge Instructions (Addendum)
Sedation or Anesthesia, Adult Care After The medicines you received during the procedure or test today can sometimes cause you to be:  Confused.   Dizzy.   Sleepy.   Clumsy.  HOME CARE INSTRUCTIONS  Do not engage in any activity that requires you to be alert or coordinated for the next 24 hours. This includes driving, operating heavy machinery, using power tools, cooking, climbing, or riding a bicycle.  No swimming, hot tubs, or baths for the next 24 hours.   Stay with family, friends, or a caregiver for the next 24 hours.   Do not make any important decisions in the next 24 hours, such as signing contracts, making important commitments, or making expensive purchases.   Do not drink alcohol for 24 hours.   Avoid solid food if you feel sick to your stomach (have nausea) or if you vomit.   Drink plenty of fluids.   Only take over-the-counter or prescription medicines for pain, discomfort, or fever as directed by your caregiver.   Call your caregiver if you have persistent nausea or if you vomit more than once.   If you cannot reach your caregiver, go to or contact the emergency department.  SEEK IMMEDIATE MEDICAL CARE IF:   You vomit more than once.   You have strange or unusual behavior.   You have a fever.   You have difficulty urinating.   You have severe pain.    Endoscopic Retrograde Cholangiopancreatography (ERCP) Care After  Please read the instructions outlined below and refer to this sheet in the next few weeks. These discharge instructions provide you with general information on caring for yourself after you leave the hospital. Your doctor may also give you specific instructions. While your treatment has been planned according to the most current medical practices available, unavoidable complications occasionally occur. If you have any problems or questions after discharge, please call your doctor. HOME CARE INSTRUCTIONS Activity  You may resume your regular  activity but move at a slower pace for the next 24 hours.   Take frequent rest periods for the next 24 hours.   Walking will help expel (get rid of) the air and reduce the bloated feeling in your abdomen.   No driving for 24 hours (because of the anesthesia (medicine) used during the test).   You may shower.   Do not sign any important legal documents or operate any machinery for 24 hours (because of the anesthesia used during the test).  Nutrition  Drink plenty of fluids.   You may resume your normal diet.   Begin with a light meal and progress to your normal diet.   Avoid alcoholic beverages for 24 hours or as instructed by your caregiver.  Medications You may resume your normal medications unless your caregiver tells you otherwise. What you can expect today  You may experience abdominal discomfort such as a feeling of fullness or "gas" pains.   You may experience a sore throat for 2 to 3 days. This is normal. Gargling with salt water may help this.  Follow-up Your doctor will discuss the results of your test with you. SEEK IMMEDIATE MEDICAL CARE IF:  You have excessive nausea (feeling sick to your stomach) and/or vomiting.   You have severe abdominal pain and distention (swelling).   You have trouble swallowing.   You have a temperature over 100 F (37.8 C).   You have rectal bleeding or vomiting of blood.  Document Released: 07/16/2004 Document Revised: 11/21/2011 Document Reviewed: 01/27/2008  ExitCare Patient Information 2012 [place], [place].

## 2012-04-16 NOTE — Transfer of Care (Signed)
Immediate Anesthesia Transfer of Care Note  Patient: Alexis Smith  Procedure(s) Performed: Procedure(s) (LRB): ENDOSCOPIC RETROGRADE CHOLANGIOPANCREATOGRAPHY (ERCP) (N/A)  Patient Location: PACU  Anesthesia Type: MAC  Level of Consciousness: awake, sedated and patient cooperative  Airway & Oxygen Therapy: Patient Spontanous Breathing and Patient connected to nasal cannula oxygen  Post-op Assessment: Report given to PACU RN and Post -op Vital signs reviewed and stable  Post vital signs: Reviewed and stable  Complications: No apparent anesthesia complications

## 2012-04-16 NOTE — H&P (View-Only) (Signed)
04/08/2012 Alexis Smith [id number] 12/30/1915   HISTORY OF PRESENT ILLNESS: Patient is a 78 year old female who had a colonoscopy in 2001 with Dr. Burchard for abdominal pain and bowel habit changes. Findings included only diverticulosis. She was last seen by Dr. Brodie in July 2012. At that time she was having constipation felt to be due to a rectocele. She is now referred here for evaluation of abnormal liver function studies. Patient is here with her husband. She reports about a 20 pound weight loss recently.  Her urine is dark. Labs yesterday pertinent for total bilirubin 12.1, direct bilirubin 8.4, alkaline phosphatase 1929, AST 247, ALT 126, BUN mildly elevated at 28, normal creatinine at 0.8, GFR normal at 67.1. Ammonia 124. No significant complaints. She has occasional lower abdominal discomfort associated with defecation. She has occasional chest discomfort but not recently. She takes Linzess as needed for constipation.    Past Medical History  Diagnosis Date  . Arthritis   . FHx: allergies   . Thyroid condition   . Diverticulitis   . Anemia   . Anxiety   . Depression   . History of orthostatic hypotension   . Restless leg syndrome   . Peripheral neuropathy   . Insomnia   . Osteopenia   . GERD (gastroesophageal reflux disease)   . Hemorrhoidal skin tag   . Hyperlipidemia   . Hypothyroidism   . Diverticulosis   . Hemorrhoids   . Chronic urethritis   . Weight loss    Past Surgical History  Procedure Date  . Abdominal hysterectomy 1960  . Breast lumpectomy     left  . Hand surgery   . Hemorrhoid surgery     reports that she has never smoked. She has never used smokeless tobacco. She reports that she does not drink alcohol or use illicit drugs. family history includes Cancer in her brother; Colon cancer in her brother; Dementia in her sister; and Stroke in her father. Allergies  Allergen Reactions  . Codeine Phosphate     REACTION: unspecified  . Tetanus Toxoid Adsorbed      REACTION: unspecified      Outpatient Encounter Prescriptions as of 04/08/2012  Medication Sig Dispense Refill  . ALPRAZolam (XANAX) 1 MG tablet Take 1 tablet (1 mg total) by mouth 4 (four) times daily as needed for sleep.  120 tablet  3  . butalbital-acetaminophen-caffeine (FIORICET) 50-325-40 MG per tablet Take 2 tablets by mouth every 6 (six) hours as needed for headache.  100 tablet  5  . clotrimazole-betamethasone (LOTRISONE) cream Apply 1 application topically 2 (two) times daily.        . Linaclotide (LINZESS) 145 MCG CAPS Take 1 capsule by mouth 1 day or 1 dose.  30 capsule  11  . NORVASC 5 MG tablet TAKE 1 TABLET EACH DAY.  30 each  5  . omeprazole (PRILOSEC) 20 MG capsule Take 1 capsule (20 mg total) by mouth daily.  30 capsule  11  . pramoxine-hydrocortisone (ANALPRAM HC) cream Apply topically 2 (two) times daily.        . PRILOSEC OTC 20 MG tablet TAKE 1 TABLET TWICE DAILY.  180 each  1  . REQUIP 5 MG tablet TAKE ONE TABLET AT BEDTIME.  30 each  3  . sertraline (ZOLOFT) 100 MG tablet Take 0.5 tablets (50 mg total) by mouth daily.  30 tablet  11  . SYNTHROID 50 MCG tablet TAKE 1 TABLET ONCE DAILY.  30 each  11      REVIEW OF SYSTEMS  : All other systems reviewed and negative except where noted in the History of Present Illness.   PHYSICAL EXAM: BP 144/76  Pulse 80  Ht 5' (1.524 m)  Wt 91 lb 9.6 oz (41.549 kg)  BMI 17.89 kg/m2 General:  Well dressed frail white female in no acute distress Head: Normocephalic and atraumatic Eyes:  sclerae icteric Ears: Mildly decreased auditory acuity Mouth: No deformity or lesions Neck: Supple, no masses.  Lungs: Clear throughout to auscultation Heart: Regular rate and rhythm Abdomen: Soft, non distended, nontender. No masses or hepatomegaly noted. Normal Bowel sounds Rectal: not done Musculoskeletal: Symmetrical with no gross deformities  Skin: No lesions on visible extremities. jaundice Extremities: No edema or deformities  noted Neurological: Alert oriented, grossly nonfocal Cervical Nodes:  No significant cervical adenopathy Psychological:  Alert and cooperative. Normal mood and affect  ASSESSMENT AND PLAN;  1. Markedly abnormal LFTs and significant weight loss concerning for underlying malignancy. She hasn't had any abdominal pain ( at least not recently) making her painless jaundice more concerning. A noncontrast CTscan done in January of this year showed only a large hiatal hernia and large amount of colonic stool Patient needs repeat imaging. Her BUN is just minimally elevated at 28, creatinine normal at 0.8. Her GFR is normal. Will obtain a CT scan of the abdomen and pelvis with contrast. Recommendations based on results.  2. Chronic constipation, taking Linzess as needed. 3. Weight loss, see #1.  

## 2012-04-16 NOTE — Anesthesia Postprocedure Evaluation (Signed)
  Anesthesia Post-op Note  Patient: Alexis Smith  Procedure(s) Performed: Procedure(s) (LRB): ENDOSCOPIC RETROGRADE CHOLANGIOPANCREATOGRAPHY (ERCP) (N/A)  Patient Location: PACU  Anesthesia Type: MAC  Level of Consciousness: awake and alert   Airway and Oxygen Therapy: Patient Spontanous Breathing  Post-op Pain: mild  Post-op Assessment: Post-op Vital signs reviewed, Patient's Cardiovascular Status Stable, Respiratory Function Stable, Patent Airway and No signs of Nausea or vomiting  Post-op Vital Signs: stable  Complications: No apparent anesthesia complications

## 2012-04-16 NOTE — Anesthesia Preprocedure Evaluation (Addendum)
Anesthesia Evaluation  Patient identified by MRN, date of birth, ID band Patient awake    Reviewed: Allergy & Precautions, H&P , NPO status , Patient's Chart, lab work & pertinent test results  Airway Mallampati: II TM Distance: >3 FB Neck ROM: Full    Dental No notable dental hx.    Pulmonary  CXR: moderate hiatal hernia  breath sounds clear to auscultation  Pulmonary exam normal       Cardiovascular hypertension, Pt. on medications Rhythm:Regular Rate:Normal     Neuro/Psych  Headaches, PSYCHIATRIC DISORDERS Anxiety Depression  Neuromuscular disease    GI/Hepatic negative GI ROS, Neg liver ROS, GERD-  Medicated,  Endo/Other  Hypothyroidism   Renal/GU negative Renal ROS  negative genitourinary   Musculoskeletal negative musculoskeletal ROS (+)   Abdominal   Peds negative pediatric ROS (+)  Hematology negative hematology ROS (+)   Anesthesia Other Findings   Reproductive/Obstetrics negative OB ROS                          Anesthesia Physical Anesthesia Plan  ASA: III  Anesthesia Plan: MAC   Post-op Pain Management:    Induction: Intravenous  Airway Management Planned: Nasal Cannula  Additional Equipment:   Intra-op Plan:   Post-operative Plan: Extubation in OR  Informed Consent: I have reviewed the patients History and Physical, chart, labs and discussed the procedure including the risks, benefits and alternatives for the proposed anesthesia with the patient or authorized representative who has indicated his/her understanding and acceptance.   Dental advisory given  Plan Discussed with: CRNA  Anesthesia Plan Comments: (Potassium 6.0 today, but 3.2 on 04/07/12. Cr 1.0)        Anesthesia Quick Evaluation

## 2012-04-17 ENCOUNTER — Encounter (HOSPITAL_COMMUNITY): Payer: Self-pay | Admitting: Gastroenterology

## 2012-04-17 ENCOUNTER — Telehealth: Payer: Self-pay | Admitting: Gastroenterology

## 2012-04-17 NOTE — Telephone Encounter (Signed)
Pts husband states they were told to call for a follow-up appt next week with Willette Cluster NP per Dr. Arlyce Dice. Pt scheduled to see Willette Cluster NP 04/23/12@2pm . Dr. Arlyce Dice please advise if anything else needs to be done.

## 2012-04-18 NOTE — Telephone Encounter (Signed)
Needs follow up LFTs.

## 2012-04-18 NOTE — Telephone Encounter (Signed)
Needs LFTs

## 2012-04-20 ENCOUNTER — Telehealth: Payer: Self-pay | Admitting: Internal Medicine

## 2012-04-20 ENCOUNTER — Other Ambulatory Visit (INDEPENDENT_AMBULATORY_CARE_PROVIDER_SITE_OTHER): Payer: Medicare Other

## 2012-04-20 ENCOUNTER — Ambulatory Visit (INDEPENDENT_AMBULATORY_CARE_PROVIDER_SITE_OTHER): Payer: Medicare Other | Admitting: Internal Medicine

## 2012-04-20 ENCOUNTER — Encounter: Payer: Self-pay | Admitting: Internal Medicine

## 2012-04-20 VITALS — BP 138/80 | HR 80 | Temp 97.3°F | Resp 16 | Wt 93.0 lb

## 2012-04-20 DIAGNOSIS — R350 Frequency of micturition: Secondary | ICD-10-CM

## 2012-04-20 DIAGNOSIS — IMO0001 Reserved for inherently not codable concepts without codable children: Secondary | ICD-10-CM

## 2012-04-20 DIAGNOSIS — K219 Gastro-esophageal reflux disease without esophagitis: Secondary | ICD-10-CM

## 2012-04-20 DIAGNOSIS — K831 Obstruction of bile duct: Secondary | ICD-10-CM

## 2012-04-20 DIAGNOSIS — R7989 Other specified abnormal findings of blood chemistry: Secondary | ICD-10-CM

## 2012-04-20 DIAGNOSIS — E039 Hypothyroidism, unspecified: Secondary | ICD-10-CM

## 2012-04-20 DIAGNOSIS — G4700 Insomnia, unspecified: Secondary | ICD-10-CM

## 2012-04-20 DIAGNOSIS — R17 Unspecified jaundice: Secondary | ICD-10-CM

## 2012-04-20 LAB — HEPATIC FUNCTION PANEL
Bilirubin, Direct: 1.1 mg/dL — ABNORMAL HIGH (ref 0.0–0.3)
Total Bilirubin: 2.3 mg/dL — ABNORMAL HIGH (ref 0.3–1.2)

## 2012-04-20 LAB — URINALYSIS, ROUTINE W REFLEX MICROSCOPIC
Nitrite: NEGATIVE
Specific Gravity, Urine: 1.01 (ref 1.000–1.030)
Urine Glucose: NEGATIVE
Urobilinogen, UA: 0.2 (ref 0.0–1.0)

## 2012-04-20 MED ORDER — CEFUROXIME AXETIL 250 MG PO TABS: 250.0000 mg | ORAL_TABLET | Freq: Every day | ORAL | Status: DC

## 2012-04-20 MED ORDER — HYDROXYZINE HCL 50 MG PO TABS: 50.0000 mg | ORAL_TABLET | Freq: Every evening | ORAL | Status: DC | PRN

## 2012-04-20 NOTE — Progress Notes (Signed)
Patient ID: Alexis Smith, female   DOB: [birth year], 78 y.o.   MRN: [id number]   Subjective:    Patient ID: Alexis Smith, female    DOB: 1916-07-03, 78 y.o.   MRN: [id number]  HPI   F/u jaundice and wt loss  She did have ERCP/STENT last wk Hydroxyzine 40 mg helped her to sleep for a few hrs F/u restless legs, legs feel cold, insomnia  Wt Readings from Last 3 Encounters:  04/20/12 93 lb (42.185 kg)  04/14/12 92 lb (41.731 kg)  04/08/12 91 lb 9.6 oz (41.549 kg)   BP Readings from Last 3 Encounters:  04/20/12 138/80  04/16/12 154/85  04/16/12 154/85     Review of Systems  Constitutional: Positive for chills, diaphoresis, fatigue and unexpected weight change (lost 20 lbs). Negative for activity change and appetite change.  HENT: Negative for congestion, mouth sores and sinus pressure.   Eyes: Negative for visual disturbance.  Respiratory: Negative for cough and chest tightness.   Gastrointestinal: Positive for abdominal pain (LLQ). Negative for nausea and blood in stool.  Genitourinary: Negative for frequency, difficulty urinating and vaginal pain.  Musculoskeletal: Negative for back pain and gait problem.  Skin: Negative for pallor and rash.  Neurological: Positive for weakness. Negative for dizziness, tremors and headaches.  Psychiatric/Behavioral: Negative for confusion and sleep disturbance.       Objective:   Physical Exam  Constitutional: She appears well-developed. No distress.       Thin   HENT:  Head: Normocephalic.  Right Ear: External ear normal.  Left Ear: External ear normal.  Nose: Nose normal.  Mouth/Throat: Oropharynx is clear and moist.  Eyes: Conjunctivae are normal. Pupils are equal, round, and reactive to light. Right eye exhibits no discharge. Left eye exhibits no discharge.  Neck: Normal range of motion. Neck supple. No JVD present. No tracheal deviation present. No thyromegaly present.  Cardiovascular: Normal rate, regular rhythm and normal heart  sounds.   Pulmonary/Chest: No stridor. No respiratory distress. She has no wheezes.  Abdominal: Soft. Bowel sounds are normal. She exhibits no distension and no mass. There is tenderness (LLQ). There is no rebound and no guarding.  Musculoskeletal: She exhibits no edema and no tenderness.  Lymphadenopathy:    She has no cervical adenopathy.  Neurological: She displays normal reflexes. No cranial nerve deficit. She exhibits normal muscle tone. Coordination normal.  Skin: No rash noted. No erythema.       Jaundiced less today  Psychiatric: She has a normal mood and affect. Her behavior is normal. Judgment and thought content normal.  Decr hearing Better color today  Lab Results  Component Value Date   WBC 6.3 11/13/2011   HGB 13.6 11/13/2011   HCT 40.5 11/13/2011   PLT 212.0 11/13/2011   GLUCOSE 98 04/14/2012   CHOL 203* 11/13/2011   TRIG 137.0 11/13/2011   HDL 82.50 11/13/2011   LDLDIRECT 101.7 11/13/2011   ALT 62* 04/14/2012   AST 125* 04/14/2012   NA 145 04/14/2012   K 6.0* 04/14/2012   CL 109 04/14/2012   CREATININE 0.9 04/14/2012   BUN 30* 04/14/2012   CO2 26 04/14/2012   TSH 1.88 11/13/2011   INR 1.0 04/14/2012   ERCP - stricture      Assessment & Plan:

## 2012-04-20 NOTE — Telephone Encounter (Signed)
Alexis Smith, please, inform patient that she has a UTI Take Ceftin Thx

## 2012-04-20 NOTE — Assessment & Plan Note (Signed)
Acute 4/13 ?due to Remeron vs other. She has been on Fioricet 4 tab/d. Abd [place] CT was OK; latest CT 4/13 - dilated bile ducts ERCP/STENT - Dr Arlyce Dice 5/13

## 2012-04-20 NOTE — Assessment & Plan Note (Addendum)
We will try Hydroxyzine w/caution - start with 50 m at hs. Can use 75-100 mg at hs if needed

## 2012-04-20 NOTE — Assessment & Plan Note (Signed)
STENT 5/13 Repeat labs

## 2012-04-20 NOTE — Assessment & Plan Note (Signed)
Doing good  

## 2012-04-20 NOTE — Telephone Encounter (Signed)
Orders in system

## 2012-04-20 NOTE — Assessment & Plan Note (Signed)
Continue with current prescription therapy as reflected on the Med list.  

## 2012-04-21 NOTE — Telephone Encounter (Signed)
Pt informed

## 2012-04-23 ENCOUNTER — Ambulatory Visit (INDEPENDENT_AMBULATORY_CARE_PROVIDER_SITE_OTHER): Payer: Medicare Other | Admitting: Nurse Practitioner

## 2012-04-23 ENCOUNTER — Encounter: Payer: Self-pay | Admitting: Nurse Practitioner

## 2012-04-23 VITALS — BP 134/76 | HR 80 | Ht 60.0 in | Wt 93.0 lb

## 2012-04-23 DIAGNOSIS — R634 Abnormal weight loss: Secondary | ICD-10-CM

## 2012-04-23 DIAGNOSIS — K831 Obstruction of bile duct: Secondary | ICD-10-CM

## 2012-04-23 NOTE — Progress Notes (Signed)
Alexis Smith [id number] 04-26-1916   HISTORY OR PRESENT ILLNESS :  Patient is a 78 year old female who we saw a few weeks ago for obstructive jaundice. She had an ERCP by Dr. Arlyce Dice, a high grade distal cbd stricture was found and a metal stent was placed across the stricture. Cytology has come back negative. She feels well. No abdominal pain, fever or ongoing weight loss. She is here with her husband to discuss the next step  Current Medications, Allergies, Past Medical History, Past Surgical History, Family History and Social History were reviewed in [employer] record.   PHYSICAL EXAMINATION : General:  Well developed  female in no acute distress bdomen: Soft, nondistended, nontender.  Neurological: Oriented, grossly nonfocal Psychological:  Alert and cooperative. Normal mood and affect  ASSESSMENT AND PLAN :  Distal, high grade CBD stricture, s/p ERCP with metal stent placement. Cytology is negative. LFTs have significantly improved post stenting. She looks and feels well. We discussed the fact the stricture could still be (probably is) malignant despite negative cytology. Of course other etiologies such as papillary stenosis is not impossible. She is comfortable with not knowing definitely if this is malignant or not. At this point we will follow her LFTs. She and husband know to call us ASAP for fevers, abdominal pain, worsening jaundice.

## 2012-04-23 NOTE — Patient Instructions (Signed)
Come to our lab on 05-21-2012, basement level.

## 2012-04-24 ENCOUNTER — Encounter: Payer: Self-pay | Admitting: Nurse Practitioner

## 2012-04-24 DIAGNOSIS — R634 Abnormal weight loss: Secondary | ICD-10-CM | POA: Insufficient documentation

## 2012-04-24 DIAGNOSIS — K831 Obstruction of bile duct: Secondary | ICD-10-CM | POA: Insufficient documentation

## 2012-04-24 NOTE — Progress Notes (Signed)
Agree with Ms. Guenther's assessment and plan. Zahria Ding E. Yanna Leaks, MD, FACG   

## 2012-04-28 ENCOUNTER — Other Ambulatory Visit: Payer: Self-pay | Admitting: *Deleted

## 2012-04-28 ENCOUNTER — Telehealth: Payer: Self-pay

## 2012-04-28 ENCOUNTER — Other Ambulatory Visit (INDEPENDENT_AMBULATORY_CARE_PROVIDER_SITE_OTHER): Payer: Medicare Other

## 2012-04-28 DIAGNOSIS — R300 Dysuria: Secondary | ICD-10-CM

## 2012-04-28 LAB — URINALYSIS, ROUTINE W REFLEX MICROSCOPIC
Bilirubin Urine: NEGATIVE
Hgb urine dipstick: NEGATIVE
Ketones, ur: NEGATIVE
Nitrite: NEGATIVE

## 2012-04-30 ENCOUNTER — Telehealth: Payer: Self-pay | Admitting: Gastroenterology

## 2012-04-30 ENCOUNTER — Telehealth: Payer: Self-pay | Admitting: *Deleted

## 2012-04-30 NOTE — Telephone Encounter (Signed)
Left message for pt to call back  °

## 2012-04-30 NOTE — Telephone Encounter (Signed)
Husband states pt is still having some discomfort and not eating. Offered pt an appt for tomorrow at 10:30 or 3:45pm with Dr. Arlyce Dice. Husband states he will discuss things with his wife and call back.

## 2012-04-30 NOTE — Telephone Encounter (Signed)
Left detailed mess informing pt UA results normal.

## 2012-05-01 ENCOUNTER — Encounter: Payer: Self-pay | Admitting: Gastroenterology

## 2012-05-01 ENCOUNTER — Other Ambulatory Visit: Payer: Medicare Other

## 2012-05-01 ENCOUNTER — Ambulatory Visit (INDEPENDENT_AMBULATORY_CARE_PROVIDER_SITE_OTHER): Payer: Medicare Other | Admitting: Gastroenterology

## 2012-05-01 VITALS — BP 138/72 | HR 66 | Ht 61.0 in | Wt 90.0 lb

## 2012-05-01 DIAGNOSIS — N390 Urinary tract infection, site not specified: Secondary | ICD-10-CM

## 2012-05-01 DIAGNOSIS — K831 Obstruction of bile duct: Secondary | ICD-10-CM

## 2012-05-01 DIAGNOSIS — R978 Other abnormal tumor markers: Secondary | ICD-10-CM

## 2012-05-01 DIAGNOSIS — C569 Malignant neoplasm of unspecified ovary: Secondary | ICD-10-CM

## 2012-05-01 LAB — CANCER ANTIGEN 19-9: CA 19-9: 8.8 U/mL (ref <35.0)

## 2012-05-01 MED ORDER — CIPROFLOXACIN HCL 500 MG PO TABS: 500.0000 mg | ORAL_TABLET | Freq: Two times a day (BID) | ORAL | Status: AC

## 2012-05-01 NOTE — Assessment & Plan Note (Signed)
She has symptoms are very urinary tract infection despite therapy with cefuroxime.  Medications #1 urine culture #2 begin Cipro 500 mg twice a day #3 follow with Dr. Posey Rea

## 2012-05-01 NOTE — Progress Notes (Signed)
History of Present Illness:  Alexis Smith has returned for evaluation of anorexia and lower abdominal pain. She has a malignant appearing bile duct stricture. A uncovered metal biliary stent was placed approximately 2 weeks ago. Jaundice has disappeared. She complains of anorexia, occasional nausea, and dysuria with urinary frequency. She also has suprapubic pain. She was given cefuroxime with marginal improvement. Urinalysis shows white cells in the urine. No cultures have been done.    Review of Systems: Pertinent positive and negative review of systems were noted in the above HPI section. All other review of systems were otherwise negative.    Current Medications, Allergies, Past Medical History, Past Surgical History, Family History and Social History were reviewed in [employer] electronic medical record  Vital signs were reviewed in today's medical record. Physical Exam: General: Well developed , well nourished, no acute distress Head: Normocephalic and atraumatic Eyes:  sclerae anicteric, EOMI Ears: Normal auditory acuity Mouth: No deformity or lesions Lungs: Clear throughout to auscultation Heart: Regular rate and rhythm; no murmurs, rubs or bruits Abdomen: Soft, non tender and non distended. No masses, hepatosplenomegaly or hernias noted. Normal Bowel sounds Rectal:deferred Musculoskeletal: Symmetrical with no gross deformities  Pulses:  Normal pulses noted Extremities: No clubbing, cyanosis, edema or deformities noted Neurological: Alert oriented x 4, grossly nonfocal Psychological:  Alert and cooperative. Normal mood and affect

## 2012-05-01 NOTE — Assessment & Plan Note (Signed)
She has a malignant appearing bile duct stricture with unknown primary. Symptoms of nausea, anorexia and suprapubic pain could be attributable to a chronic urinary tract infection. Alternatively, since this may be do to an underlying malignancy.  Recommendations #1 check tumor markers including CA 19-9, CEA and CA 125 #2 followup CT scan in 4 weeks

## 2012-05-01 NOTE — Patient Instructions (Signed)
Go directly to the basement today to have your labs drawn. Your antibiotics will be at your pharmacy today. Follow up with Dr. Posey Rea. Also follow up with Dr. Arlyce Dice in 4 weeks.

## 2012-05-04 LAB — URINE CULTURE: Colony Count: 30000

## 2012-05-04 NOTE — Telephone Encounter (Signed)
error 

## 2012-05-05 ENCOUNTER — Telehealth: Payer: Self-pay | Admitting: Gastroenterology

## 2012-05-05 NOTE — Telephone Encounter (Signed)
Spoke with pts husband and he is aware. 

## 2012-05-05 NOTE — Telephone Encounter (Signed)
Tumor markers were all negative.   That she followup with Dr. Posey Rea  regarding her urinary symptoms? Increase omeprazole to twice a day

## 2012-05-05 NOTE — Telephone Encounter (Signed)
Husband called wanting results of pts lab work. Also states that the pt has no appetite and that when she does eat any solid food she states it feels like it wants to come back up. States that after she eats something she gets the hiccups. Reports that she is still complaining of hurting in the lower part of her abdomen. Husband wants to know if there is anything else to do, concerned about her losing weight. Please advise.

## 2012-05-09 ENCOUNTER — Other Ambulatory Visit: Payer: Self-pay | Admitting: Internal Medicine

## 2012-05-12 NOTE — Telephone Encounter (Signed)
MD out of office. Is this ok to refill?... 05/12/12@11 :52am/LMB

## 2012-05-25 ENCOUNTER — Other Ambulatory Visit (INDEPENDENT_AMBULATORY_CARE_PROVIDER_SITE_OTHER): Payer: Medicare Other

## 2012-05-25 ENCOUNTER — Ambulatory Visit (INDEPENDENT_AMBULATORY_CARE_PROVIDER_SITE_OTHER): Payer: Medicare Other | Admitting: Internal Medicine

## 2012-05-25 ENCOUNTER — Encounter: Payer: Self-pay | Admitting: Internal Medicine

## 2012-05-25 VITALS — BP 120/82 | HR 88 | Temp 98.2°F | Resp 16 | Wt 88.0 lb

## 2012-05-25 DIAGNOSIS — R17 Unspecified jaundice: Secondary | ICD-10-CM

## 2012-05-25 DIAGNOSIS — K5900 Constipation, unspecified: Secondary | ICD-10-CM

## 2012-05-25 DIAGNOSIS — R109 Unspecified abdominal pain: Secondary | ICD-10-CM

## 2012-05-25 DIAGNOSIS — R634 Abnormal weight loss: Secondary | ICD-10-CM

## 2012-05-25 DIAGNOSIS — E039 Hypothyroidism, unspecified: Secondary | ICD-10-CM

## 2012-05-25 LAB — CBC WITH DIFFERENTIAL/PLATELET
Basophils Relative: 0.6 % (ref 0.0–3.0)
Eosinophils Relative: 1.7 % (ref 0.0–5.0)
HCT: 33.7 % — ABNORMAL LOW (ref 36.0–46.0)
Monocytes Relative: 5.2 % (ref 3.0–12.0)
Neutrophils Relative %: 73.7 % (ref 43.0–77.0)
Platelets: 258 10*3/uL (ref 150.0–400.0)
RBC: 3.71 Mil/uL — ABNORMAL LOW (ref 3.87–5.11)
WBC: 6 10*3/uL (ref 4.5–10.5)

## 2012-05-25 LAB — BASIC METABOLIC PANEL
BUN: 20 mg/dL (ref 6–23)
Calcium: 9.1 mg/dL (ref 8.4–10.5)
Chloride: 103 mEq/L (ref 96–112)
Creatinine, Ser: 0.9 mg/dL (ref 0.4–1.2)

## 2012-05-25 LAB — HEPATIC FUNCTION PANEL
ALT: 11 U/L (ref 0–35)
Bilirubin, Direct: 0.3 mg/dL (ref 0.0–0.3)
Total Bilirubin: 0.5 mg/dL (ref 0.3–1.2)
Total Protein: 7.1 g/dL (ref 6.0–8.3)

## 2012-05-25 LAB — URINALYSIS, ROUTINE W REFLEX MICROSCOPIC
Nitrite: NEGATIVE
Total Protein, Urine: 100
Urine Glucose: NEGATIVE
pH: 6 (ref 5.0–8.0)

## 2012-05-25 LAB — TSH: TSH: 2.15 u[IU]/mL (ref 0.35–5.50)

## 2012-05-25 NOTE — Assessment & Plan Note (Signed)
Watching  

## 2012-05-25 NOTE — Assessment & Plan Note (Signed)
Chronic due to a bile duct obstruction/tumor

## 2012-05-25 NOTE — Assessment & Plan Note (Signed)
Continue with current prescription therapy as reflected on the Med list.  

## 2012-05-25 NOTE — Progress Notes (Signed)
   Subjective:    Patient ID: Alexis Smith, female    DOB: [birth year], 78 y.o.   MRN: [id number]  Abdominal Pain Pertinent negatives include no frequency, headaches or nausea.     F/u jaundice and wt loss ; c/o fatigue She did have ERCP/STENT last wk Hydroxyzine 40 mg helped her to sleep for a few hrs F/u restless legs, legs feel cold, insomnia  Wt Readings from Last 3 Encounters:  05/25/12 88 lb (39.917 kg)  05/01/12 90 lb (40.824 kg)  04/23/12 93 lb (42.185 kg)   BP Readings from Last 3 Encounters:  05/25/12 120/82  05/01/12 138/72  04/23/12 134/76     Review of Systems  Constitutional: Positive for chills, diaphoresis, fatigue and unexpected weight change (lost 20 lbs). Negative for activity change and appetite change.  HENT: Negative for congestion, mouth sores and sinus pressure.   Eyes: Negative for visual disturbance.  Respiratory: Negative for cough and chest tightness.   Gastrointestinal: Positive for abdominal pain. Negative for nausea and blood in stool.  Genitourinary: Negative for frequency, difficulty urinating and vaginal pain.  Musculoskeletal: Negative for back pain and gait problem.  Skin: Negative for pallor and rash.  Neurological: Positive for weakness. Negative for dizziness, tremors and headaches.  Psychiatric/Behavioral: Negative for confusion and sleep disturbance.       Objective:   Physical Exam  Constitutional: She appears well-developed. No distress.       Thin   HENT:  Head: Normocephalic.  Right Ear: External ear normal.  Left Ear: External ear normal.  Nose: Nose normal.  Mouth/Throat: Oropharynx is clear and moist.  Eyes: Conjunctivae are normal. Pupils are equal, round, and reactive to light. Right eye exhibits no discharge. Left eye exhibits no discharge.  Neck: Normal range of motion. Neck supple. No JVD present. No tracheal deviation present. No thyromegaly present.  Cardiovascular: Normal rate, regular rhythm and normal heart  sounds.   Pulmonary/Chest: No stridor. No respiratory distress. She has no wheezes.  Abdominal: Soft. Bowel sounds are normal. She exhibits no distension and no mass. There is no tenderness. There is no rebound and no guarding.  Musculoskeletal: She exhibits no edema and no tenderness.  Lymphadenopathy:    She has no cervical adenopathy.  Neurological: She displays normal reflexes. No cranial nerve deficit. She exhibits normal muscle tone. Coordination normal.  Skin: No rash noted. No erythema.       No jaundiced  today  Psychiatric: She has a normal mood and affect. Her behavior is normal. Judgment and thought content normal.  Decr hearing Better color today  Lab Results  Component Value Date   WBC 6.3 11/13/2011   HGB 13.6 11/13/2011   HCT 40.5 11/13/2011   PLT 212.0 11/13/2011   GLUCOSE 98 04/14/2012   CHOL 203* 11/13/2011   TRIG 137.0 11/13/2011   HDL 82.50 11/13/2011   LDLDIRECT 101.7 11/13/2011   ALT 33 04/20/2012   AST 40* 04/20/2012   NA 145 04/14/2012   K 6.0* 04/14/2012   CL 109 04/14/2012   CREATININE 0.9 04/14/2012   BUN 30* 04/14/2012   CO2 26 04/14/2012   TSH 1.88 11/13/2011   INR 1.0 04/14/2012   ERCP - stricture/stent      Assessment & Plan:

## 2012-05-25 NOTE — Assessment & Plan Note (Signed)
Linzess prn 

## 2012-05-25 NOTE — Assessment & Plan Note (Signed)
Resolved w/STENT

## 2012-05-25 NOTE — Patient Instructions (Signed)
Stop Amlodipine 

## 2012-05-26 ENCOUNTER — Ambulatory Visit: Payer: Medicare Other

## 2012-05-26 ENCOUNTER — Telehealth: Payer: Self-pay | Admitting: Internal Medicine

## 2012-05-26 DIAGNOSIS — N390 Urinary tract infection, site not specified: Secondary | ICD-10-CM

## 2012-05-26 NOTE — Telephone Encounter (Signed)
Alexis Smith, please, ask the lab to run a urine cx Thx

## 2012-05-26 NOTE — Telephone Encounter (Signed)
Add on for urine cx faxed down to [facility] lab.

## 2012-05-28 ENCOUNTER — Telehealth: Payer: Self-pay | Admitting: Internal Medicine

## 2012-05-28 ENCOUNTER — Telehealth: Payer: Self-pay

## 2012-05-28 MED ORDER — AMPICILLIN 500 MG PO CAPS: 500.0000 mg | ORAL_CAPSULE | Freq: Three times a day (TID) | ORAL | Status: AC

## 2012-05-28 NOTE — Telephone Encounter (Signed)
Alexis Smith, please, inform patient that all labs are normal except for UTI Start Ampicillin - called in Thx

## 2012-05-28 NOTE — Telephone Encounter (Signed)
Pt's spouse called requesting lab results.

## 2012-05-28 NOTE — Telephone Encounter (Signed)
Will wait for abx sensitivity thx

## 2012-05-29 ENCOUNTER — Telehealth: Payer: Self-pay | Admitting: *Deleted

## 2012-05-29 NOTE — Telephone Encounter (Signed)
Message copied by Deatra James on Fri May 29, 2012 11:53 AM ------      Message from: Etheleen Sia      Created: Fri May 29, 2012 10:44 AM       PLEASE CALL WITH LAB RESULTS

## 2012-05-29 NOTE — Telephone Encounter (Signed)
Pt has already been notified of results. Closing encounter... 05/29/12@11 :54am/LMB

## 2012-05-29 NOTE — Telephone Encounter (Signed)
Pt's husband informed.

## 2012-06-05 ENCOUNTER — Encounter: Payer: Self-pay | Admitting: Gastroenterology

## 2012-06-05 ENCOUNTER — Ambulatory Visit (INDEPENDENT_AMBULATORY_CARE_PROVIDER_SITE_OTHER): Payer: Medicare Other | Admitting: Gastroenterology

## 2012-06-05 VITALS — BP 130/62 | HR 72 | Ht 61.0 in | Wt 89.4 lb

## 2012-06-05 DIAGNOSIS — R1310 Dysphagia, unspecified: Secondary | ICD-10-CM

## 2012-06-05 DIAGNOSIS — K831 Obstruction of bile duct: Secondary | ICD-10-CM

## 2012-06-05 DIAGNOSIS — R630 Anorexia: Secondary | ICD-10-CM | POA: Insufficient documentation

## 2012-06-05 MED ORDER — MEGESTROL ACETATE 625 MG/5ML PO SUSP: 625.0000 mg | Freq: Every day | ORAL | Status: DC

## 2012-06-05 NOTE — Progress Notes (Signed)
History of Present Illness:  Mrs. Alexis Smith has returned for followup of a bile duct stricture. Her main complaint is dysphagia to solids. With dysphagia she may have hiccups. She denies pyrosis. Appetite is only fair. She denies abdominal pain. She still has some dysuria.    Review of Systems: Pertinent positive and negative review of systems were noted in the above HPI section. All other review of systems were otherwise negative.    Current Medications, Allergies, Past Medical History, Past Surgical History, Family History and Social History were reviewed in [employer] electronic medical record  Vital signs were reviewed in today's medical record. Physical Exam: General: Well developed , well nourished, no acute distress Head: Normocephalic and atraumatic Eyes:  sclerae anicteric, EOMI Ears: Normal auditory acuity Mouth: No deformity or lesions Lungs: Clear throughout to auscultation Heart: Regular rate and rhythm; no murmurs, rubs or bruits Abdomen: Soft, non distended. No masses, hepatosplenomegaly or hernias noted. Normal Bowel sounds. There is minimal tenderness to palpation in the left lower quadrant Rectal:deferred Musculoskeletal: Symmetrical with no gross deformities  Pulses:  Normal pulses noted Extremities: No clubbing, cyanosis, edema or deformities noted Neurological: Alert oriented x 4, grossly nonfocal Psychological:  Alert and cooperative. Normal mood and affect

## 2012-06-05 NOTE — Patient Instructions (Addendum)
Upper GI Endoscopy Upper GI endoscopy means using a flexible scope to look at the esophagus, stomach, and upper small bowel. This is done to make a diagnosis in people with heartburn, abdominal pain, or abnormal bleeding. Sometimes an endoscope is needed to remove foreign bodies or food that become stuck in the esophagus; it can also be used to take biopsy samples. For the best results, do not eat or drink for 8 hours before having your upper endoscopy.  To perform the endoscopy, you will probably be sedated and your throat will be numbed with a special spray. The endoscope is then slowly passed down your throat (this will not interfere with your breathing). An endoscopy exam takes 15 to 30 minutes to complete and there is no real pain. Patients rarely remember much about the procedure. The results of the test may take several days if a biopsy or other test is taken.  You may have a sore throat after an endoscopy exam. Serious complications are very rare. Stick to liquids and soft foods until your pain is better. Do not drive a car or operate any dangerous equipment for at least 24 hours after being sedated. SEEK IMMEDIATE MEDICAL CARE IF:   You have severe throat pain.   You have shortness of breath.   You have bleeding problems.   You have a fever.   You have difficulty recovering from your sedation.  Document Released: 01/09/2005 Document Revised: 11/21/2011 Document Reviewed: 12/04/2008 [employer] Patient Information 2012 [employer]. 

## 2012-06-05 NOTE — Assessment & Plan Note (Signed)
Probable of esophageal stricture.  Recommendations #1 upper endoscopy with dilatation as indicated

## 2012-06-05 NOTE — Assessment & Plan Note (Addendum)
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 Plan no further w/u for now.

## 2012-06-05 NOTE — Assessment & Plan Note (Signed)
Plan trial of Megace

## 2012-06-08 ENCOUNTER — Telehealth: Payer: Self-pay | Admitting: Gastroenterology

## 2012-06-08 ENCOUNTER — Telehealth: Payer: Self-pay | Admitting: *Deleted

## 2012-06-08 NOTE — Telephone Encounter (Signed)
Rf req for Fioricet 50-325-40. 2 po q 6 hr prn HA. # 100. Ok to Rf?

## 2012-06-08 NOTE — Telephone Encounter (Signed)
OK to fill this prescription #50 with additional refills x1 Thank you!

## 2012-06-08 NOTE — Telephone Encounter (Signed)
Answered all messages asked by the patients husband. Told them to contact me if they have any more questions

## 2012-06-09 MED ORDER — BUTALBITAL-APAP-CAFFEINE 50-325-40 MG PO TABS: 2.0000 | ORAL_TABLET | Freq: Four times a day (QID) | ORAL | Status: DC | PRN

## 2012-06-09 NOTE — Telephone Encounter (Signed)
Done

## 2012-06-10 ENCOUNTER — Other Ambulatory Visit: Payer: Self-pay | Admitting: *Deleted

## 2012-06-10 NOTE — Telephone Encounter (Signed)
A user error has taken place: open by mistake... 06/10/12@9 :32am/LMB

## 2012-06-15 HISTORY — PX: OTHER SURGICAL HISTORY: SHX169

## 2012-06-16 ENCOUNTER — Telehealth: Payer: Self-pay | Admitting: Gastroenterology

## 2012-06-16 NOTE — Telephone Encounter (Signed)
Pts husband states that the pt is still having chest burning and discomfort. She will start to eat and then she just does not feel like swallowing due to the discomfort. Pt is taking prilosec twice a day. Offered pt an appt with the NP but the husband really does not want to bring her in at this time. State they will wait for the appt with Arlyce Dice on the 16th. Instructed them to call back if needed. Husband verbalized understanding.

## 2012-06-29 ENCOUNTER — Other Ambulatory Visit: Payer: Self-pay | Admitting: *Deleted

## 2012-06-29 MED ORDER — BUTALBITAL-APAP-CAFFEINE 50-325-40 MG PO TABS: 2.0000 | ORAL_TABLET | Freq: Four times a day (QID) | ORAL | Status: DC | PRN

## 2012-06-30 ENCOUNTER — Ambulatory Visit (AMBULATORY_SURGERY_CENTER): Payer: Medicare Other | Admitting: Gastroenterology

## 2012-06-30 ENCOUNTER — Encounter: Payer: Self-pay | Admitting: Gastroenterology

## 2012-06-30 VITALS — BP 148/98 | HR 94 | Temp 99.8°F | Resp 22 | Ht 61.0 in | Wt 87.0 lb

## 2012-06-30 DIAGNOSIS — R1310 Dysphagia, unspecified: Secondary | ICD-10-CM

## 2012-06-30 DIAGNOSIS — K222 Esophageal obstruction: Secondary | ICD-10-CM

## 2012-06-30 MED ORDER — SODIUM CHLORIDE 0.9 % IV SOLN: 500.0000 mL | INTRAVENOUS | Status: DC

## 2012-06-30 NOTE — Patient Instructions (Addendum)
Take fiber (metamucil) daily for bowel movements    CALL DR. Blue Winther'S OFFICE SCHEDULE APPOINTMENT FOR 3-4 WEEKS.    YOU HAD AN ENDOSCOPIC PROCEDURE TODAY AT THE [facility]: Refer to the procedure report that was given to you for any specific questions about what was found during the examination.  If the procedure report does not answer your questions, please call your gastroenterologist to clarify.  If you requested that your care partner not be given the details of your procedure findings, then the procedure report has been included in a sealed envelope for you to review at your convenience later.  YOU SHOULD EXPECT: Some feelings of bloating in the abdomen. Passage of more gas than usual.  Walking can help get rid of the air that was put into your GI tract during the procedure and reduce the bloating. If you had a lower endoscopy (such as a colonoscopy or flexible sigmoidoscopy) you may notice spotting of blood in your stool or on the toilet paper. If you underwent a bowel prep for your procedure, then you may not have a normal bowel movement for a few days.  DIET:   FOLLOW DILATATION DIET TODAY : LIQUIDS ONLY UNTIL 4:30 PM THEN SOFT FOODS THE REST OF TODAY   .  Drink plenty of fluids but you should avoid alcoholic beverages for 24 hours.  ACTIVITY: Your care partner should take you home directly after the procedure.  You should plan to take it easy, moving slowly for the rest of the day.  You can resume normal activity the day after the procedure however you should NOT DRIVE or use heavy machinery for 24 hours (because of the sedation medicines used during the test).    SYMPTOMS TO REPORT IMMEDIATELY: A gastroenterologist can be reached at any hour.  During normal business hours, 8:30 AM to 5:00 PM Monday through Friday, call [phone].  After hours and on weekends, please call the GI answering service at [phone] who will take a message and have the physician on call  contact you.     Following upper endoscopy (EGD)  Vomiting of blood or coffee ground material  New chest pain or pain under the shoulder blades  Painful or persistently difficult swallowing  New shortness of breath  Fever of 100F or higher  Black, tarry-looking stools  FOLLOW UP: If any biopsies were taken you will be contacted by phone or by letter within the next 1-3 weeks.  Call your gastroenterologist if you have not heard about the biopsies in 3 weeks.  Our staff will call the home number listed on your records the next business day following your procedure to check on you and address any questions or concerns that you may have at that time regarding the information given to you following your procedure. This is a courtesy call and so if there is no answer at the home number and we have not heard from you through the emergency physician on call, we will assume that you have returned to your regular daily activities without incident.  SIGNATURES/CONFIDENTIALITY: You and/or your care partner have signed paperwork which will be entered into your electronic medical record.  These signatures attest to the fact that that the information above on your After Visit Summary has been reviewed and is understood.  Full responsibility of the confidentiality of this discharge information lies with you and/or your care-partner.

## 2012-06-30 NOTE — Progress Notes (Signed)
Patient did not have preoperative order for IV antibiotic SSI prophylaxis. (G8918)  Patient did not experience any of the following events: a burn prior to discharge; a fall within the facility; wrong site/side/patient/procedure/implant event; or a hospital transfer or hospital admission upon discharge from the facility. (G8907)  

## 2012-06-30 NOTE — Op Note (Signed)
[facility] [address] [place], [place]  [address]  ENDOSCOPY PROCEDURE REPORT  PATIENT:  Alexis, Smith  MR#:  [id number] BIRTHDATE:  11/04/1916, 89 yrs. old  GENDER:  female  ENDOSCOPIST:  Barbette Hair. Arlyce Dice, MD Referred by:  PROCEDURE DATE:  06/30/2012 PROCEDURE:  EGD, diagnostic 43235, Maloney Dilation of Esophagus ASA CLASS:  Class III INDICATIONS:  dysphagia  MEDICATIONS:   MAC sedation, administered by CRNA propofol 120mg IV, 0.6cc simethancone 0.6 cc PO TOPICAL ANESTHETIC:  DESCRIPTION OF PROCEDURE:   After the risks and benefits of the procedure were explained, informed consent was obtained.  The LB GIF-H180 [id number] endoscope was introduced through the mouth and advanced to the third portion of the duodenum.  The instrument was slowly withdrawn as the mucosa was fully examined. <<PROCEDUREIMAGES>>  A stricture was found at the gastroesophageal junction (see image1 and image2). Moderate stricture Dilation with maloney dilator 18mm Mild resistance; no heme  A hiatal hernia was found. 10cm sliding hiatal hernia  other findings in the descending duodenum. metal biliary stent in place  Otherwise the examination was normal (see image4).    Retroflexed views revealed no abnormalities.    The scope was then withdrawn from the patient and the procedure completed.  COMPLICATIONS:  None  ENDOSCOPIC IMPRESSION: 1) Stricture at the gastroesophageal junction - s/p maloney dilitation 2) Hiatal hernia 3) Otherwise normal examination RECOMMENDATIONS: 1) Call office next 2-3 days to schedule an office appointment for 3-4 weeks  ______________________________ Barbette Hair. Arlyce Dice, MD  CC:  Linda Hedges. Plotnikov, MD  n. Rosalie DoctorBarbette Hair. Kaplan at 06/30/2012 03:02 PM  Gaetano Net, [phone]

## 2012-07-01 ENCOUNTER — Telehealth: Payer: Self-pay

## 2012-07-01 NOTE — Telephone Encounter (Signed)
  Follow up Call-  Call back number 06/30/2012  Post procedure Call Back phone  # [phone]  Permission to leave phone message Yes     Patient questions:  Do you have a fever, pain , or abdominal swelling? no Pain Score  0 *  Have you tolerated food without any problems? yes  Have you been able to return to your normal activities? yes  Do you have any questions about your discharge instructions: Diet   no Medications  no Follow up visit  no  Do you have questions or concerns about your Care? no  Actions: * If pain score is 4 or above: No action needed, pain <4.

## 2012-07-03 ENCOUNTER — Telehealth: Payer: Self-pay | Admitting: Gastroenterology

## 2012-07-03 NOTE — Telephone Encounter (Signed)
Unable to reach patient at the number given. Will try again later

## 2012-07-03 NOTE — Telephone Encounter (Signed)
Spoke with patient's husband and explained that is the earliest appointment available at this time. He will check back and see if anyone cancels next week.

## 2012-07-06 ENCOUNTER — Other Ambulatory Visit (INDEPENDENT_AMBULATORY_CARE_PROVIDER_SITE_OTHER): Payer: Medicare Other

## 2012-07-06 ENCOUNTER — Ambulatory Visit (INDEPENDENT_AMBULATORY_CARE_PROVIDER_SITE_OTHER): Payer: Medicare Other | Admitting: Internal Medicine

## 2012-07-06 ENCOUNTER — Ambulatory Visit (INDEPENDENT_AMBULATORY_CARE_PROVIDER_SITE_OTHER)
Admission: RE | Admit: 2012-07-06 | Discharge: 2012-07-06 | Disposition: A | Discharge status: Home / Self Care | Payer: Medicare Other | Source: Ambulatory Visit | Attending: Internal Medicine | Admitting: Internal Medicine

## 2012-07-06 ENCOUNTER — Encounter: Payer: Self-pay | Admitting: Internal Medicine

## 2012-07-06 VITALS — BP 102/70 | HR 88 | Temp 98.4°F | Resp 16 | Wt 89.0 lb

## 2012-07-06 DIAGNOSIS — R1310 Dysphagia, unspecified: Secondary | ICD-10-CM

## 2012-07-06 DIAGNOSIS — N390 Urinary tract infection, site not specified: Secondary | ICD-10-CM

## 2012-07-06 DIAGNOSIS — R17 Unspecified jaundice: Secondary | ICD-10-CM

## 2012-07-06 DIAGNOSIS — R634 Abnormal weight loss: Secondary | ICD-10-CM

## 2012-07-06 DIAGNOSIS — R109 Unspecified abdominal pain: Secondary | ICD-10-CM | POA: Insufficient documentation

## 2012-07-06 DIAGNOSIS — D649 Anemia, unspecified: Secondary | ICD-10-CM

## 2012-07-06 LAB — URINALYSIS
Bilirubin Urine: NEGATIVE
Hgb urine dipstick: NEGATIVE
Ketones, ur: NEGATIVE
Specific Gravity, Urine: 1.02 (ref 1.000–1.030)
Urine Glucose: NEGATIVE
Urobilinogen, UA: 0.2 (ref 0.0–1.0)

## 2012-07-06 LAB — URINALYSIS, ROUTINE W REFLEX MICROSCOPIC
Hgb urine dipstick: NEGATIVE
Ketones, ur: NEGATIVE
Total Protein, Urine: 30
Urine Glucose: NEGATIVE

## 2012-07-06 MED ORDER — SERTRALINE HCL 100 MG PO TABS: 100.0000 mg | ORAL_TABLET | Freq: Two times a day (BID) | ORAL | Status: DC

## 2012-07-06 MED ORDER — ROPINIROLE HCL 5 MG PO TABS: 5.0000 mg | ORAL_TABLET | Freq: Every day | ORAL | Status: DC

## 2012-07-06 MED ORDER — BUTALBITAL-APAP-CAFFEINE 50-325-40 MG PO TABS: 2.0000 | ORAL_TABLET | Freq: Four times a day (QID) | ORAL | Status: DC | PRN

## 2012-07-06 NOTE — Patient Instructions (Signed)
Use Mylanta 4 times a day before meals

## 2012-07-06 NOTE — Progress Notes (Signed)
Patient ID: Alexis Smith, female   DOB: [birth year], 78 y.o.   MRN: [id number]   Subjective:    Patient ID: Alexis Smith, female    DOB: Nov 06, 1916, 78 y.o.   MRN: [id number]  Abdominal Pain This is a new problem. The current episode started in the past 7 days. The onset quality is undetermined. The problem occurs 2 to 4 times per day. The problem has been unchanged. The pain is severe. The quality of the pain is colicky, burning, sharp and tearing. The abdominal pain radiates to the epigastric region and chest. Pertinent negatives include no fever, frequency, headaches, nausea or vomiting. The pain is aggravated by eating. The pain is relieved by nothing. She has tried nothing for the symptoms. Prior diagnostic workup includes GI consult and upper endoscopy. EGD and dilatation of her esophagus last week     F/u jaundice and wt loss ; c/o fatigue She did have ERCP/STENT; now she is s/p EGD w/esoph dilatation Hydroxyzine 40 mg helped her to sleep for a few hrs F/u restless legs, legs feel cold, insomnia  Wt Readings from Last 3 Encounters:  07/06/12 89 lb (40.37 kg)  06/30/12 87 lb (39.463 kg)  06/05/12 89 lb 6 oz (40.54 kg)   BP Readings from Last 3 Encounters:  07/06/12 102/70  06/30/12 148/98  06/05/12 130/62     Review of Systems  Constitutional: Positive for diaphoresis, fatigue and unexpected weight change (lost 20 lbs). Negative for fever, chills, activity change and appetite change.  HENT: Negative for congestion, mouth sores and sinus pressure.   Eyes: Negative for visual disturbance.  Respiratory: Negative for cough and chest tightness.   Gastrointestinal: Positive for abdominal pain. Negative for nausea, vomiting and blood in stool.  Genitourinary: Negative for frequency, difficulty urinating and vaginal pain.  Musculoskeletal: Negative for back pain and gait problem.  Skin: Negative for pallor and rash.  Neurological: Positive for weakness. Negative for dizziness, tremors  and headaches.  Psychiatric/Behavioral: Negative for suicidal ideas, confusion and disturbed wake/sleep cycle. The patient is nervous/anxious.        Objective:   Physical Exam  Constitutional: She appears well-developed. No distress.       Thin   HENT:  Head: Normocephalic.  Right Ear: External ear normal.  Left Ear: External ear normal.  Nose: Nose normal.  Mouth/Throat: Oropharynx is clear and moist.  Eyes: Conjunctivae are normal. Pupils are equal, round, and reactive to light. Right eye exhibits no discharge. Left eye exhibits no discharge.  Neck: Normal range of motion. Neck supple. No JVD present. No tracheal deviation present. No thyromegaly present.  Cardiovascular: Normal rate, regular rhythm and normal heart sounds.   Pulmonary/Chest: No stridor. No respiratory distress. She has no wheezes.  Abdominal: Soft. Bowel sounds are normal. She exhibits no distension and no mass. There is no tenderness. There is no rebound and no guarding.  Musculoskeletal: She exhibits no edema and no tenderness.  Lymphadenopathy:    She has no cervical adenopathy.  Neurological: She displays normal reflexes. No cranial nerve deficit. She exhibits normal muscle tone. Coordination normal.  Skin: No rash noted. No erythema.       No jaundiced  Psychiatric: She has a normal mood and affect. Her behavior is normal. Judgment and thought content normal.  Decr hearing Good skin color today  Lab Results  Component Value Date   WBC 6.0 05/25/2012   HGB 11.1* 05/25/2012   HCT 33.7* 05/25/2012   PLT 258.0 05/25/2012  GLUCOSE 105* 05/25/2012   CHOL 203* 11/13/2011   TRIG 137.0 11/13/2011   HDL 82.50 11/13/2011   LDLDIRECT 101.7 11/13/2011   ALT 11 05/25/2012   AST 28 05/25/2012   NA 140 05/25/2012   K 3.7 05/25/2012   CL 103 05/25/2012   CREATININE 0.9 05/25/2012   BUN 20 05/25/2012   CO2 25 05/25/2012   TSH 2.15 05/25/2012   INR 1.0 04/14/2012   ERCP - stricture/stent EGD      Assessment & Plan:

## 2012-07-07 ENCOUNTER — Other Ambulatory Visit: Payer: Self-pay | Admitting: Gastroenterology

## 2012-07-07 ENCOUNTER — Telehealth: Payer: Self-pay | Admitting: Gastroenterology

## 2012-07-07 ENCOUNTER — Telehealth: Payer: Self-pay | Admitting: Internal Medicine

## 2012-07-07 DIAGNOSIS — R109 Unspecified abdominal pain: Secondary | ICD-10-CM

## 2012-07-07 NOTE — Assessment & Plan Note (Signed)
S/p esoph dilatation 7/13 Dr Marzetta Board help is very appreciated!

## 2012-07-07 NOTE — Telephone Encounter (Signed)
Pt scheduled to come for labs tomorrow. CT of abdomen and barium swallow scheduled for 07/14/12 @[facility]  arrival time 10:15am. Pt to pick up contrast prior to scan. Pt scheduled to see Willette Cluster NP 07/15/12@11am . Dr. Arlyce Dice is the supervising physician this day. Pts husband aware of appt dates and times.

## 2012-07-07 NOTE — Telephone Encounter (Signed)
She needs repeat LFTs, CBC and she needs a repeat CT of the abdomen and, finally, a barium swallow. I should see her next week

## 2012-07-07 NOTE — Telephone Encounter (Signed)
Misty Stanley, please, inform patient that her UA and abd xray are normal Thx

## 2012-07-07 NOTE — Telephone Encounter (Signed)
Pts husband states the pt saw her PCP yesterday and had an xray and was tested for a UTI. Tests were normal. Pt is c/o epigastric pain on her right side under her breast, states that sometimes it goes all the way across. Pt is taking prilosec BID and this is not helping with the pain. Husband states she is still having trouble swallowing. Please advise.

## 2012-07-07 NOTE — Assessment & Plan Note (Signed)
Monitoring

## 2012-07-07 NOTE — Telephone Encounter (Signed)
Left detailed mess informing pt of below.  

## 2012-07-07 NOTE — Assessment & Plan Note (Signed)
7/13 epigastric pain post esoph dilatation - poss erosion. Exam is nl Abd xray Mylanta qid ac Prilosec - increase to bid

## 2012-07-07 NOTE — Assessment & Plan Note (Signed)
Repeat UA 

## 2012-07-07 NOTE — Assessment & Plan Note (Signed)
No relapse post-stent

## 2012-07-07 NOTE — Assessment & Plan Note (Signed)
Discussed.

## 2012-07-08 ENCOUNTER — Other Ambulatory Visit (INDEPENDENT_AMBULATORY_CARE_PROVIDER_SITE_OTHER): Payer: Medicare Other

## 2012-07-08 DIAGNOSIS — R109 Unspecified abdominal pain: Secondary | ICD-10-CM

## 2012-07-08 LAB — HEPATIC FUNCTION PANEL
Albumin: 3.8 g/dL (ref 3.5–5.2)
Alkaline Phosphatase: 81 U/L (ref 39–117)
Total Bilirubin: 0.5 mg/dL (ref 0.3–1.2)

## 2012-07-08 LAB — CBC WITH DIFFERENTIAL/PLATELET
Eosinophils Relative: 2.6 % (ref 0.0–5.0)
HCT: 31 % — ABNORMAL LOW (ref 36.0–46.0)
Hemoglobin: 10.1 g/dL — ABNORMAL LOW (ref 12.0–15.0)
Lymphs Abs: 1.1 10*3/uL (ref 0.7–4.0)
MCV: 86.4 fl (ref 78.0–100.0)
Monocytes Relative: 8.8 % (ref 3.0–12.0)
Neutro Abs: 4.2 10*3/uL (ref 1.4–7.7)
RDW: 16 % — ABNORMAL HIGH (ref 11.5–14.6)
WBC: 6 10*3/uL (ref 4.5–10.5)

## 2012-07-08 LAB — COMPREHENSIVE METABOLIC PANEL
CO2: 23 mEq/L (ref 19–32)
Creatinine, Ser: 1 mg/dL (ref 0.4–1.2)
GFR: 58.23 mL/min — ABNORMAL LOW (ref >60.00)
Glucose, Bld: 90 mg/dL (ref 70–99)
Total Bilirubin: 0.5 mg/dL (ref 0.3–1.2)

## 2012-07-13 ENCOUNTER — Ambulatory Visit: Payer: Medicare Other | Admitting: Internal Medicine

## 2012-07-14 ENCOUNTER — Ambulatory Visit (HOSPITAL_COMMUNITY)
Admission: RE | Admit: 2012-07-14 | Discharge: 2012-07-14 | Disposition: A | Discharge status: Home / Self Care | Payer: Medicare Other | Source: Ambulatory Visit | Attending: Gastroenterology | Admitting: Gastroenterology

## 2012-07-14 ENCOUNTER — Encounter (HOSPITAL_COMMUNITY): Payer: Self-pay

## 2012-07-14 DIAGNOSIS — R634 Abnormal weight loss: Secondary | ICD-10-CM | POA: Insufficient documentation

## 2012-07-14 DIAGNOSIS — K449 Diaphragmatic hernia without obstruction or gangrene: Secondary | ICD-10-CM | POA: Insufficient documentation

## 2012-07-14 DIAGNOSIS — K7689 Other specified diseases of liver: Secondary | ICD-10-CM | POA: Insufficient documentation

## 2012-07-14 DIAGNOSIS — K224 Dyskinesia of esophagus: Secondary | ICD-10-CM | POA: Insufficient documentation

## 2012-07-14 DIAGNOSIS — J9819 Other pulmonary collapse: Secondary | ICD-10-CM | POA: Insufficient documentation

## 2012-07-14 DIAGNOSIS — R109 Unspecified abdominal pain: Secondary | ICD-10-CM | POA: Insufficient documentation

## 2012-07-14 DIAGNOSIS — Z8542 Personal history of malignant neoplasm of other parts of uterus: Secondary | ICD-10-CM | POA: Insufficient documentation

## 2012-07-14 DIAGNOSIS — I700 Atherosclerosis of aorta: Secondary | ICD-10-CM | POA: Insufficient documentation

## 2012-07-14 DIAGNOSIS — R110 Nausea: Secondary | ICD-10-CM | POA: Insufficient documentation

## 2012-07-14 MED ORDER — IOHEXOL 300 MG/ML  SOLN
80.0000 mL | Freq: Once | INTRAMUSCULAR | Status: AC | PRN
  Administered 2012-07-14: 80 mL via INTRAVENOUS

## 2012-07-15 ENCOUNTER — Ambulatory Visit (INDEPENDENT_AMBULATORY_CARE_PROVIDER_SITE_OTHER): Payer: Medicare Other | Admitting: Nurse Practitioner

## 2012-07-15 ENCOUNTER — Encounter: Payer: Self-pay | Admitting: Nurse Practitioner

## 2012-07-15 VITALS — BP 96/58 | HR 72 | Ht 61.0 in | Wt 87.4 lb

## 2012-07-15 DIAGNOSIS — K831 Obstruction of bile duct: Secondary | ICD-10-CM

## 2012-07-15 DIAGNOSIS — R1310 Dysphagia, unspecified: Secondary | ICD-10-CM

## 2012-07-15 DIAGNOSIS — K222 Esophageal obstruction: Secondary | ICD-10-CM

## 2012-07-15 MED ORDER — LIDOCAINE VISCOUS 2 % MT SOLN: OROMUCOSAL | Status: DC

## 2012-07-15 MED ORDER — SUCRALFATE 1 GM/10ML PO SUSP: 1.0000 g | Freq: Four times a day (QID) | ORAL | Status: DC

## 2012-07-15 NOTE — Patient Instructions (Addendum)
We have scheduled the Endoscopy with Dr. Erick Blinks in Dr. Franne Forts absence. Directions provided.

## 2012-07-15 NOTE — Progress Notes (Signed)
Alexis Smith [id number] 01/30/1916   HISTORY OR PRESENT ILLNESS :  Patient is a 78-year-old female known to Dr. Kaplan. 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 In May she had an ERCP with placement of an uncovered metal stent. More recently, Dr. Kaplan has followed her for dysphasia She had an upper endoscopy with Maloney dilation of a gastroesophageal junction stricture approximately 2 weeks ago. Patient comes in with her husband today with complaints of ongoing swallowing problems. She has epigastric pain with meals. Patient has gotten to the point where she is consuming very little, even in the way of liquids. Patient had a CT scan of the abdomen yesterday, there were no acute findings. She also had an esophagram yesterday which showed poor esophageal peristalsis and a large hiatal hernia. The barium pill did not pass beyond the GE junction.   Current Medications, Allergies, Past Medical History, Past Surgical History, Family History and Social History were reviewed in [facility] electronic medical record.   PHYSICAL EXAMINATION : General:  Very thin white female in no acute distress Head: Normocephalic and atraumatic Eyes:  sclerae anicteric,conjunctive pink. Ears: Normal auditory acuity Neck: Supple, no masses.  Lungs: Clear throughout to auscultation Heart: Regular rate and rhythm Abdomen: Limited exam, patient and wheelchair. Abdomen soft  nondistended, nontender.  Musculoskeletal: Symmetrical with no gross deformities  Extremities: No edema or deformities noted Neurological: Oriented x 4, grossly nonfocal Cervical Nodes:  No significant cervical adenopathy Psychological:  Alert and cooperative. Normal mood and affect  ASSESSMENT AND PLAN :  1. Dysphagia  / odynophagia. Patient had EGD with Maloney dilation of a distal esophageal stricture 2 weeks ago but tablet did not pass on barium swallow yesterday. She complains of odynophagia  /epigastric pain with oral intake. Nothing on upper endoscopy 2 weeks ago to account for odynophagia. Plan is to repeat upper endoscopy with balloon dilation. At that time we will also look for candida or other reasons for the pain . In the meantime patient will continue twice daily PPI. Will add Carafate slurry. I encouraged Ensure, several cans daily.  2. biliary stricture. Malignant stricture suspected butnot proven on cytology Patient had ERCP with uncovered metal stent placement in May. Recent LFTs normal. Stable findings on CTscan yesterday  

## 2012-07-16 ENCOUNTER — Encounter: Payer: Self-pay | Admitting: Nurse Practitioner

## 2012-07-16 DIAGNOSIS — K222 Esophageal obstruction: Secondary | ICD-10-CM | POA: Insufficient documentation

## 2012-07-16 NOTE — Progress Notes (Signed)
Reviewed and agree with management. Robert D. Kaplan, M.D., FACG  

## 2012-07-23 ENCOUNTER — Telehealth: Payer: Self-pay | Admitting: Gastroenterology

## 2012-07-23 NOTE — Telephone Encounter (Signed)
New copy of prep instructions left up front for pt to pick up.

## 2012-07-28 ENCOUNTER — Encounter (HOSPITAL_COMMUNITY)
Admission: RE | Disposition: A | Discharge status: Home / Self Care | Payer: Self-pay | Source: Ambulatory Visit | Attending: Internal Medicine

## 2012-07-28 ENCOUNTER — Encounter (HOSPITAL_COMMUNITY): Payer: Self-pay

## 2012-07-28 ENCOUNTER — Ambulatory Visit (HOSPITAL_COMMUNITY): Admit: 2012-07-28 | Payer: Self-pay | Admitting: Internal Medicine

## 2012-07-28 ENCOUNTER — Encounter (HOSPITAL_COMMUNITY): Payer: Self-pay | Admitting: *Deleted

## 2012-07-28 ENCOUNTER — Ambulatory Visit (HOSPITAL_COMMUNITY)
Admission: RE | Admit: 2012-07-28 | Discharge: 2012-07-28 | Disposition: A | Discharge status: Home / Self Care | Payer: Medicare Other | Source: Ambulatory Visit | Attending: Internal Medicine | Admitting: Internal Medicine

## 2012-07-28 ENCOUNTER — Other Ambulatory Visit: Payer: Self-pay | Admitting: Internal Medicine

## 2012-07-28 DIAGNOSIS — R1310 Dysphagia, unspecified: Secondary | ICD-10-CM

## 2012-07-28 DIAGNOSIS — K2210 Ulcer of esophagus without bleeding: Secondary | ICD-10-CM | POA: Insufficient documentation

## 2012-07-28 DIAGNOSIS — K208 Other esophagitis without bleeding: Secondary | ICD-10-CM | POA: Insufficient documentation

## 2012-07-28 DIAGNOSIS — K449 Diaphragmatic hernia without obstruction or gangrene: Secondary | ICD-10-CM | POA: Insufficient documentation

## 2012-07-28 DIAGNOSIS — K831 Obstruction of bile duct: Secondary | ICD-10-CM | POA: Insufficient documentation

## 2012-07-28 DIAGNOSIS — K209 Esophagitis, unspecified without bleeding: Secondary | ICD-10-CM | POA: Insufficient documentation

## 2012-07-28 DIAGNOSIS — K222 Esophageal obstruction: Secondary | ICD-10-CM | POA: Insufficient documentation

## 2012-07-28 HISTORY — PX: ESOPHAGOGASTRODUODENOSCOPY: SHX5428

## 2012-07-28 HISTORY — PX: BALLOON DILATION: SHX5330

## 2012-07-28 HISTORY — DX: Dysphagia, unspecified: R13.10

## 2012-07-28 SURGERY — EGD (ESOPHAGOGASTRODUODENOSCOPY)
Anesthesia: Moderate Sedation

## 2012-07-28 SURGERY — EGD (ESOPHAGOGASTRODUODENOSCOPY)
Anesthesia: Moderate Sedation | Site: Esophagus

## 2012-07-28 MED ORDER — MIDAZOLAM HCL 10 MG/2ML IJ SOLN
INTRAMUSCULAR | Status: AC
  Filled 2012-07-28: qty 4

## 2012-07-28 MED ORDER — FENTANYL CITRATE 0.05 MG/ML IJ SOLN
INTRAMUSCULAR | Status: DC | PRN
  Administered 2012-07-28 (×3): 12.5 ug via INTRAVENOUS

## 2012-07-28 MED ORDER — SODIUM CHLORIDE 0.9 % IV SOLN: INTRAVENOUS | Status: DC

## 2012-07-28 MED ORDER — FLUCONAZOLE 100 MG PO TABS: 100.0000 mg | ORAL_TABLET | Freq: Every day | ORAL | Status: DC

## 2012-07-28 MED ORDER — FENTANYL CITRATE 0.05 MG/ML IJ SOLN
INTRAMUSCULAR | Status: AC
  Filled 2012-07-28: qty 2

## 2012-07-28 MED ORDER — BUTAMBEN-TETRACAINE-BENZOCAINE 2-2-14 % EX AERO
INHALATION_SPRAY | CUTANEOUS | Status: DC | PRN
  Administered 2012-07-28: 2 via TOPICAL

## 2012-07-28 MED ORDER — FENTANYL CITRATE 0.05 MG/ML IJ SOLN
INTRAMUSCULAR | Status: AC
  Filled 2012-07-28: qty 4

## 2012-07-28 MED ORDER — SODIUM CHLORIDE 0.9 % IV SOLN
INTRAVENOUS | Status: DC
  Administered 2012-07-28: 08:00:00 via INTRAVENOUS

## 2012-07-28 MED ORDER — MIDAZOLAM HCL 10 MG/2ML IJ SOLN
INTRAMUSCULAR | Status: AC
  Filled 2012-07-28: qty 2

## 2012-07-28 MED ORDER — MIDAZOLAM HCL 10 MG/2ML IJ SOLN
INTRAMUSCULAR | Status: DC | PRN
  Administered 2012-07-28 (×2): 1 mg via INTRAVENOUS

## 2012-07-28 NOTE — Interval H&P Note (Signed)
History and Physical Interval Note: Patient presents today for upper endoscopy. She is seen in the office earlier this month. She's had persistent dysphagia with some odynophagia even after upper endoscopy with Maloney dilation to 18 mm was performed on 06/30/2012. She had a barium swallow after this EGD which showed a persistent stricture the GE junction to 13 mm barium tablet would not pass. We discussed endoscopy including the risks and benefits and she and her husband are agreeable to proceed. We will plan: Dilation across the GE junction if appropriate   07/28/2012 8:33 AM  Alexis Smith  has presented today for surgery, with the diagnosis of dysphagia  The various methods of treatment have been discussed with the patient and family. After consideration of risks, benefits and other options for treatment, the patient has consented to  Procedure(s) (LRB): ESOPHAGOGASTRODUODENOSCOPY (EGD) (N/A) MALONEY DILATION (N/A) as a surgical intervention .  The patient's history has been reviewed, patient examined, no change in status, stable for surgery.  I have reviewed the patient's chart and labs.  Questions were answered to the patient's satisfaction.     Alexis Smith M

## 2012-07-28 NOTE — H&P (View-Only) (Signed)
Alexis Smith [id number] June 19, 1916   HISTORY OR PRESENT ILLNESS :  Patient is a 78 year old female known to Dr. Arlyce Dice. 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 In May she had an ERCP with placement of an uncovered metal stent. More recently, Dr. Arlyce Dice has followed her for dysphasia She had an upper endoscopy with [facility] dilation of a gastroesophageal junction stricture approximately 2 weeks ago. Patient comes in with her husband today with complaints of ongoing swallowing problems. She has epigastric pain with meals. Patient has gotten to the point where she is consuming very little, even in the way of liquids. Patient had a CT scan of the abdomen yesterday, there were no acute findings. She also had an esophagram yesterday which showed poor esophageal peristalsis and a large hiatal hernia. The barium pill did not pass beyond the GE junction.   Current Medications, Allergies, Past Medical History, Past Surgical History, Family History and Social History were reviewed in [employer] record.   PHYSICAL EXAMINATION : General:  Very thin white female in no acute distress Head: Normocephalic and atraumatic Eyes:  sclerae anicteric,conjunctive pink. Ears: Normal auditory acuity Neck: Supple, no masses.  Lungs: Clear throughout to auscultation Heart: Regular rate and rhythm Abdomen: Limited exam, patient and wheelchair. Abdomen soft  nondistended, nontender.  Musculoskeletal: Symmetrical with no gross deformities  Extremities: No edema or deformities noted Neurological: Oriented x 4, grossly nonfocal Cervical Nodes:  No significant cervical adenopathy Psychological:  Alert and cooperative. Normal mood and affect  ASSESSMENT AND PLAN :  1. Dysphagia  / odynophagia. Patient had EGD with [facility] dilation of a distal esophageal stricture 2 weeks ago but tablet did not pass on barium swallow yesterday. She complains of odynophagia  /epigastric pain with oral intake. Nothing on upper endoscopy 2 weeks ago to account for odynophagia. Plan is to repeat upper endoscopy with balloon dilation. At that time we will also look for candida or other reasons for the pain . In the meantime patient will continue twice daily PPI. Will add Carafate slurry. I encouraged Ensure, several cans daily.  2. biliary stricture. Malignant stricture suspected butnot proven on cytology Patient had ERCP with uncovered metal stent placement in May. Recent LFTs normal. Stable findings on CTscan yesterday

## 2012-07-28 NOTE — Op Note (Signed)
[facility] [address] [place], [place]  [address]  ENDOSCOPY PROCEDURE REPORT  PATIENT:  Alexis, Smith  MR#:  [id number] BIRTHDATE:  1916/01/03, 89 yrs. old  GENDER:  female ENDOSCOPIST:  Carie Caddy. Deniah Saia, MD Referred by:  Barbette Hair. Arlyce Dice, M.D. PROCEDURE DATE:  07/28/2012 PROCEDURE:  EGD with biopsy, 432[address], EGD with balloon dilatation ASA CLASS:  Class III INDICATIONS:  dysphagia, odynophagia, abnormal imaging (barium swallow) MEDICATIONS:   Fentanyl 37.5 mcg IV, Versed 2 mg IV TOPICAL ANESTHETIC:  Cetacaine Spray  DESCRIPTION OF PROCEDURE:   After the risks benefits and alternatives of the procedure were thoroughly explained, informed consent was obtained.  The Pentax Gastroscope [id number] endoscope was introduced through the mouth and advanced to the second portion of the duodenum, without limitations.  The instrument was slowly withdrawn as the mucosa was fully examined. <<PROCEDUREIMAGES>>  Esophagitis was found in the mid an distal esophagus characterized by white exudate and sloughing of the superficial mucosa. Multiple biopsies were obtained and sent to pathology.  A stricture was found at the gastroesophageal junction which was located 33 cm from the incisors, though the adult upper endoscope passed without resistance. Balloon dilation was performed using a TTS balloon to 15 mm. A large, roughly 7 cm, hiatal hernia was found.  Otherwise normal stomach.  The duodenal bulb was normal in appearance, as was the postbulbar duodenum.  A previously placed metal stent was seen protruding from the ampulla and appeared to be in good position.   Retroflexion exam demonstrated findings as previously described.    The scope was then withdrawn from the patient and the procedure completed.  COMPLICATIONS:  None  ENDOSCOPIC IMPRESSION: 1) Esophagitis in the mid and distal esophagus. Multiple biopsies taken and sent to pathology 2) Stricture at the gastroesophageal  junction. Balloon dilation performed 3) Hiatal hernia, large. 4) Otherwise normal stomach 5) Normal duodenum. Metal biliary stent in place  RECOMMENDATIONS: 1) Await pathology results 2) Empiric treatment for candida esophagitis while awaiting biopsy results. Fluconazole 200 mg for 1 day, 100 mg for 13 days.  3) Office follow-up as scheduled on 08/10/2012 at 8:45 AM  Carie Caddy. Thanya Cegielski, MD  CC:  [name], MD Linda Hedges. Plotnikov, MD The Patient  n. eSIGNEDCarie Caddy. Jeziah Kretschmer at 07/28/2012 09:18 AM  [place] [employer], [phone]

## 2012-07-29 ENCOUNTER — Encounter (HOSPITAL_COMMUNITY): Payer: Self-pay | Admitting: Internal Medicine

## 2012-07-29 ENCOUNTER — Encounter (HOSPITAL_COMMUNITY): Payer: Self-pay

## 2012-07-31 ENCOUNTER — Telehealth: Payer: Self-pay

## 2012-07-31 NOTE — Telephone Encounter (Signed)
Discussed pathology with Mr. Alexis Smith. Per Dr. Rhea Belton the biopsy did not show yeast. Pt is to still take the medication she was given, per Dr. Rhea Belton. Mr. Lucarelli aware of follow-up appt date and time.

## 2012-08-06 ENCOUNTER — Encounter: Payer: Self-pay | Admitting: Internal Medicine

## 2012-08-07 ENCOUNTER — Telehealth: Payer: Self-pay | Admitting: Gastroenterology

## 2012-08-07 NOTE — Telephone Encounter (Signed)
Pt needs to reschedule the appt with Dr. Arlyce Dice that she had scheduled. Pt scheduled to see Dr. Arlyce Dice 08/14/12@2 :45pm. Pts husband aware of the appt date and time.

## 2012-08-10 ENCOUNTER — Ambulatory Visit: Payer: Medicare Other | Admitting: Gastroenterology

## 2012-08-14 ENCOUNTER — Encounter: Payer: Self-pay | Admitting: Gastroenterology

## 2012-08-14 ENCOUNTER — Ambulatory Visit (INDEPENDENT_AMBULATORY_CARE_PROVIDER_SITE_OTHER): Payer: Medicare Other | Admitting: Gastroenterology

## 2012-08-14 VITALS — BP 130/68 | HR 72 | Ht 61.0 in | Wt 84.0 lb

## 2012-08-14 DIAGNOSIS — R634 Abnormal weight loss: Secondary | ICD-10-CM

## 2012-08-14 DIAGNOSIS — K831 Obstruction of bile duct: Secondary | ICD-10-CM

## 2012-08-14 DIAGNOSIS — K222 Esophageal obstruction: Secondary | ICD-10-CM

## 2012-08-14 NOTE — Patient Instructions (Addendum)
Resume megace We will contact you with [facility]

## 2012-08-14 NOTE — Assessment & Plan Note (Signed)
Etiology for weight loss is not apparent. I remain concerned about an underlying malignancy, especially in view of a malignant appearing bile duct stricture.  Recommendations #1 resume Megace #2 nutrition consult

## 2012-08-14 NOTE — Progress Notes (Signed)
History of Present Illness:  Mrs. Bower has returned following dilatation of a distal esophageal stricture. She was treated for possible candidiasis although this was not seen at pathology.  She claims her appetite is fairly good although she continues to lose weight. She is weaker and has fallen several times. She complains of pain in her left ribs and shoulder from where she fell. Pain actually is improving, however. Recent liver tests were normal.  She has been taking Xanax several times a day and Fioricet for pain.    Review of Systems: Pertinent positive and negative review of systems were noted in the above HPI section. All other review of systems were otherwise negative.    Current Medications, Allergies, Past Medical History, Past Surgical History, Family History and Social History were reviewed in [employer] electronic medical record  Vital signs were reviewed in today's medical record. Physical Exam: General: Well developed , well nourished, no acute distress ert and cooperative. Normal mood and affect

## 2012-08-14 NOTE — Assessment & Plan Note (Signed)
Dysphagia is improved since her last dilatation. She was dilated to 15 mm.  Recommendations #1 repeat dilatation in several weeks

## 2012-08-14 NOTE — Assessment & Plan Note (Signed)
Weight loss continues for unclear reasons. There is no obvious malignancy although she appears to have a malignant stricture. Esophageal stricture is not interfering with her ability to eat. With weight loss she has grown weaker.  Recommendations #1 resume Megace #2 nutrition consult

## 2012-08-18 NOTE — Assessment & Plan Note (Signed)
Malignant appearing stricture without obvious mass by CT.

## 2012-08-22 ENCOUNTER — Other Ambulatory Visit: Payer: Self-pay | Admitting: Internal Medicine

## 2012-08-24 NOTE — Telephone Encounter (Signed)
Ok to Rf in PCP absence? Thanks!! 

## 2012-08-24 NOTE — Telephone Encounter (Signed)
Faxed hardcopy to pharmacy. 

## 2012-08-24 NOTE — Telephone Encounter (Signed)
Done hardcopy to robin  

## 2012-09-01 ENCOUNTER — Ambulatory Visit (INDEPENDENT_AMBULATORY_CARE_PROVIDER_SITE_OTHER): Payer: Medicare Other

## 2012-09-01 DIAGNOSIS — Z23 Encounter for immunization: Secondary | ICD-10-CM

## 2012-09-22 ENCOUNTER — Encounter: Payer: Self-pay | Admitting: Internal Medicine

## 2012-09-22 ENCOUNTER — Ambulatory Visit (INDEPENDENT_AMBULATORY_CARE_PROVIDER_SITE_OTHER): Payer: Medicare Other | Admitting: Internal Medicine

## 2012-09-22 VITALS — BP 120/68 | HR 72 | Temp 96.9°F | Resp 16 | Wt 86.2 lb

## 2012-09-22 DIAGNOSIS — K219 Gastro-esophageal reflux disease without esophagitis: Secondary | ICD-10-CM

## 2012-09-22 DIAGNOSIS — Z8679 Personal history of other diseases of the circulatory system: Secondary | ICD-10-CM

## 2012-09-22 DIAGNOSIS — R634 Abnormal weight loss: Secondary | ICD-10-CM

## 2012-09-22 DIAGNOSIS — E039 Hypothyroidism, unspecified: Secondary | ICD-10-CM

## 2012-09-22 DIAGNOSIS — R17 Unspecified jaundice: Secondary | ICD-10-CM

## 2012-09-22 MED ORDER — ALPRAZOLAM 1 MG PO TABS: 1.0000 mg | ORAL_TABLET | Freq: Three times a day (TID) | ORAL | Status: DC | PRN

## 2012-09-22 MED ORDER — HYDROXYZINE HCL 50 MG PO TABS: 50.0000 mg | ORAL_TABLET | Freq: Every evening | ORAL | Status: DC | PRN

## 2012-09-22 MED ORDER — BUTALBITAL-APAP-CAFFEINE 50-325-40 MG PO TABS: 2.0000 | ORAL_TABLET | Freq: Four times a day (QID) | ORAL | Status: DC | PRN

## 2012-09-22 MED ORDER — OMEPRAZOLE 20 MG PO CPDR: 20.0000 mg | DELAYED_RELEASE_CAPSULE | Freq: Every day | ORAL | Status: DC

## 2012-09-22 NOTE — Progress Notes (Signed)
   Subjective:    Patient ID: Alexis Smith, female    DOB: 1916/09/20, 78 y.o.   MRN: [id number]  HPI   F/u jaundice - resolved and wt loss - better ; c/o fatigue  She did have ERCP/STENT; now she is s/p EGD w/esoph dilatation  Hydroxyzine 40 mg helped her to sleep for a few hrs  F/u restless legs, legs feel cold, insomnia  Wt Readings from Last 3 Encounters:  09/22/12 86 lb 4 oz (39.123 kg)  08/14/12 84 lb (38.102 kg)  07/28/12 87 lb (39.463 kg)   BP Readings from Last 3 Encounters:  09/22/12 120/68  08/14/12 130/68  07/28/12 142/82     Review of Systems  Constitutional: Positive for diaphoresis, fatigue and unexpected weight change (lost 20 lbs). Negative for chills, activity change and appetite change.  HENT: Negative for congestion, mouth sores and sinus pressure.   Eyes: Negative for visual disturbance.  Respiratory: Negative for cough and chest tightness.   Gastrointestinal: Negative for blood in stool.  Genitourinary: Negative for difficulty urinating and vaginal pain.  Musculoskeletal: Negative for back pain and gait problem.  Skin: Negative for pallor and rash.  Neurological: Positive for weakness. Negative for dizziness and tremors.  Psychiatric/Behavioral: Negative for suicidal ideas, confusion and disturbed wake/sleep cycle. The patient is nervous/anxious.        Objective:   Physical Exam  Constitutional: She appears well-developed. No distress.       Thin   HENT:  Head: Normocephalic.  Right Ear: External ear normal.  Left Ear: External ear normal.  Nose: Nose normal.  Mouth/Throat: Oropharynx is clear and moist.  Eyes: Conjunctivae normal are normal. Pupils are equal, round, and reactive to light. Right eye exhibits no discharge. Left eye exhibits no discharge.  Neck: Normal range of motion. Neck supple. No JVD present. No tracheal deviation present. No thyromegaly present.  Cardiovascular: Normal rate, regular rhythm and normal heart sounds.     Pulmonary/Chest: No stridor. No respiratory distress. She has no wheezes.  Abdominal: Soft. Bowel sounds are normal. She exhibits no distension and no mass. There is no tenderness. There is no rebound and no guarding.  Musculoskeletal: She exhibits no edema and no tenderness.  Lymphadenopathy:    She has no cervical adenopathy.  Neurological: She displays normal reflexes. No cranial nerve deficit. She exhibits normal muscle tone. Coordination normal.  Skin: No rash noted. No erythema.       No jaundiced  Psychiatric: She has a normal mood and affect. Her behavior is normal. Judgment and thought content normal.  Decr hearing Good skin color today  Lab Results  Component Value Date   WBC 6.0 07/08/2012   HGB 10.1* 07/08/2012   HCT 31.0* 07/08/2012   PLT 253.0 07/08/2012   GLUCOSE 90 07/08/2012   CHOL 203* 11/13/2011   TRIG 137.0 11/13/2011   HDL 82.50 11/13/2011   LDLDIRECT 101.7 11/13/2011   ALT 10 07/08/2012   ALT 10 07/08/2012   AST 20 07/08/2012   AST 20 07/08/2012   NA 140 07/08/2012   K 4.7 07/08/2012   CL 105 07/08/2012   CREATININE 1.0 07/08/2012   BUN 30* 07/08/2012   CO2 23 07/08/2012   TSH 2.15 05/25/2012   INR 1.0 04/14/2012   ERCP - stricture/stent EGD      Assessment & Plan:

## 2012-09-22 NOTE — Assessment & Plan Note (Signed)
Continue with current prescription therapy as reflected on the Med list.  

## 2012-09-22 NOTE — Assessment & Plan Note (Signed)
Doing well 

## 2012-09-22 NOTE — Assessment & Plan Note (Signed)
Doing well now 

## 2012-09-22 NOTE — Assessment & Plan Note (Signed)
Stable lately 

## 2012-09-22 NOTE — Assessment & Plan Note (Signed)
Resolved

## 2012-11-03 ENCOUNTER — Telehealth: Payer: Self-pay | Admitting: Gastroenterology

## 2012-11-03 NOTE — Telephone Encounter (Signed)
Pts husband states that the pt is c/o lower left quadrant abdominal pain. Pt is not constipated. They were seen by urology because they thought the pain could be a UTI infection but she was fine. Husband states that the pain comes and goes and it is not severe. Reports that she is able to sleep at night. Please advise.

## 2012-11-03 NOTE — Telephone Encounter (Signed)
She needs an office visit 

## 2012-11-03 NOTE — Telephone Encounter (Signed)
Spoke with pts husband and he is aware. Offered appt with [facility] but the pt wants to see Dr. Arlyce Dice. Pt scheduled to see Dr. Arlyce Dice 11/17/12@2 :30pm. Pts husband aware of appt date and time.

## 2012-11-10 ENCOUNTER — Telehealth: Payer: Self-pay | Admitting: *Deleted

## 2012-11-10 MED ORDER — HYDROCODONE-HOMATROPINE 5-1.5 MG/5ML PO SYRP: 5.0000 mL | ORAL_SOLUTION | ORAL | Status: DC | PRN

## 2012-11-10 NOTE — Telephone Encounter (Signed)
OK to ref Thx 

## 2012-11-10 NOTE — Telephone Encounter (Signed)
Rf req for Hydroco/Homatropine. 1 tsp q 6 hours prn cough. # 240 ml. Last filled 03/16/12. Ok to Rf?

## 2012-11-10 NOTE — Telephone Encounter (Signed)
Done

## 2012-11-17 ENCOUNTER — Encounter: Payer: Self-pay | Admitting: Gastroenterology

## 2012-11-17 ENCOUNTER — Ambulatory Visit (INDEPENDENT_AMBULATORY_CARE_PROVIDER_SITE_OTHER): Payer: Medicare Other | Admitting: Gastroenterology

## 2012-11-17 VITALS — BP 142/80 | HR 80 | Ht 61.0 in | Wt 88.6 lb

## 2012-11-17 DIAGNOSIS — R630 Anorexia: Secondary | ICD-10-CM

## 2012-11-17 DIAGNOSIS — K831 Obstruction of bile duct: Secondary | ICD-10-CM

## 2012-11-17 DIAGNOSIS — R1310 Dysphagia, unspecified: Secondary | ICD-10-CM

## 2012-11-17 MED ORDER — MEGESTROL ACETATE 625 MG/5ML PO SUSP: 625.0000 mg | Freq: Every day | ORAL | Status: DC

## 2012-11-17 NOTE — Patient Instructions (Addendum)
You have been scheduled for an endoscopy  Please follow written instructions given to you at your visit today. If you use inhalers (even only as needed) or a CPAP machine, please bring them with you on the day of your procedure.

## 2012-11-17 NOTE — Assessment & Plan Note (Signed)
Plan repeat dilatation

## 2012-11-17 NOTE — Progress Notes (Signed)
History of Present Illness:  Alexis Smith has returned for followup of anorexia, weight loss and biliary stricture. Appetite is fair. She's actually gained 2 pounds. She's having intermittent dysphagia to solids. She has a known stricture that was partially dilated. Constipation remains a problem. She did not improved with Linzess.  She has occasional left upper quadrant discomfort.     Review of Systems: Pertinent positive and negative review of systems were noted in the above HPI section. All other review of systems were otherwise negative.    Current Medications, Allergies, Past Medical History, Past Surgical History, Family History and Social History were reviewed in [employer] electronic medical record  Vital signs were reviewed in today's medical record. Physical Exam: General: Well developed , well nourished, no acute distress Head: Normocephalic and atraumatic Eyes:  sclerae anicteric, EOMI Ears: Normal auditory acuity Mouth: No deformity or lesions Lungs: Clear throughout to auscultation Heart: Regular rate and rhythm; no murmurs, rubs or bruits Abdomen: Soft, non tender and non distended. No masses, hepatosplenomegaly or hernias noted. Normal Bowel sounds Rectal:deferred Musculoskeletal: Symmetrical with no gross deformities  Pulses:  Normal pulses noted Extremities: No clubbing, cyanosis, edema or deformities noted Neurological: Alert oriented x 4, grossly nonfocal Psychological:  Alert and cooperative. Normal mood and affect

## 2012-11-17 NOTE — Assessment & Plan Note (Signed)
Plan trial of MiraLax

## 2012-11-17 NOTE — Assessment & Plan Note (Signed)
Etiology for anorexia has not been established. Weight has stabilized however she is down almost 25 pounds from 15 months ago.  Medications #1 trial of Megace #2 boost supplements

## 2012-11-17 NOTE — Assessment & Plan Note (Signed)
Status post biliary stent placement. I suspect this is a malignant stricture but it has not declared itself.  CT in July, 2013 negative for a pancreatic mass.

## 2012-11-25 ENCOUNTER — Encounter: Payer: Self-pay | Admitting: Internal Medicine

## 2012-11-25 ENCOUNTER — Other Ambulatory Visit (INDEPENDENT_AMBULATORY_CARE_PROVIDER_SITE_OTHER): Payer: Medicare Other

## 2012-11-25 ENCOUNTER — Ambulatory Visit (INDEPENDENT_AMBULATORY_CARE_PROVIDER_SITE_OTHER): Payer: Medicare Other | Admitting: Internal Medicine

## 2012-11-25 VITALS — BP 120/82 | HR 80 | Temp 97.4°F | Resp 16 | Wt 88.4 lb

## 2012-11-25 DIAGNOSIS — R630 Anorexia: Secondary | ICD-10-CM

## 2012-11-25 DIAGNOSIS — R634 Abnormal weight loss: Secondary | ICD-10-CM

## 2012-11-25 DIAGNOSIS — G4700 Insomnia, unspecified: Secondary | ICD-10-CM

## 2012-11-25 DIAGNOSIS — N3289 Other specified disorders of bladder: Secondary | ICD-10-CM

## 2012-11-25 DIAGNOSIS — N390 Urinary tract infection, site not specified: Secondary | ICD-10-CM

## 2012-11-25 LAB — URINALYSIS
Hgb urine dipstick: NEGATIVE
Ketones, ur: NEGATIVE
Leukocytes, UA: NEGATIVE
Specific Gravity, Urine: 1.015 (ref 1.000–1.030)
Urobilinogen, UA: 0.2 (ref 0.0–1.0)

## 2012-11-25 LAB — CBC WITH DIFFERENTIAL/PLATELET
Basophils Absolute: 0.1 10*3/uL (ref 0.0–0.1)
HCT: 30.6 % — ABNORMAL LOW (ref 36.0–46.0)
Lymphs Abs: 1.1 10*3/uL (ref 0.7–4.0)
MCV: 83.1 fl (ref 78.0–100.0)
Monocytes Absolute: 0.4 10*3/uL (ref 0.1–1.0)
Neutro Abs: 6.5 10*3/uL (ref 1.4–7.7)
Platelets: 268 10*3/uL (ref 150.0–400.0)
RDW: 16.9 % — ABNORMAL HIGH (ref 11.5–14.6)

## 2012-11-25 LAB — HEPATIC FUNCTION PANEL
AST: 27 U/L (ref 0–37)
Alkaline Phosphatase: 95 U/L (ref 39–117)
Bilirubin, Direct: 0 mg/dL (ref 0.0–0.3)
Total Bilirubin: 0.4 mg/dL (ref 0.3–1.2)

## 2012-11-25 LAB — BASIC METABOLIC PANEL
Calcium: 9 mg/dL (ref 8.4–10.5)
Creatinine, Ser: 0.8 mg/dL (ref 0.4–1.2)
GFR: 69.93 mL/min (ref >60.00)
Glucose, Bld: 96 mg/dL (ref 70–99)
Sodium: 139 mEq/L (ref 135–145)

## 2012-11-25 MED ORDER — MIRABEGRON ER 25 MG PO TB24: 25.0000 mg | ORAL_TABLET | Freq: Every day | ORAL | Status: DC

## 2012-11-25 MED ORDER — BUTALBITAL-APAP-CAFFEINE 50-325-40 MG PO TABS: 2.0000 | ORAL_TABLET | Freq: Four times a day (QID) | ORAL | Status: DC | PRN

## 2012-11-25 NOTE — Assessment & Plan Note (Signed)
Better  

## 2012-11-25 NOTE — Assessment & Plan Note (Addendum)
Better  

## 2012-11-25 NOTE — Assessment & Plan Note (Signed)
UA Will try Myrbetriq 25

## 2012-11-25 NOTE — Progress Notes (Signed)
   Subjective:   HPI   F/u jaundice - resolved and wt loss - better ; c/o fatigue - no change  She did have ERCP/STENT; now she is s/p EGD w/esoph dilatation  Hydroxyzine 40 mg helped her to sleep for a few hrs  F/u restless legs, legs feel cold, insomnia  Wt Readings from Last 3 Encounters:  11/25/12 88 lb 6 oz (40.087 kg)  11/17/12 88 lb 9.6 oz (40.189 kg)  09/22/12 86 lb 4 oz (39.123 kg)   BP Readings from Last 3 Encounters:  11/25/12 120/82  11/17/12 142/80  09/22/12 120/68     Review of Systems  Constitutional: Positive for diaphoresis, fatigue and unexpected weight change (lost 20 lbs). Negative for chills, activity change and appetite change.  HENT: Negative for congestion, mouth sores and sinus pressure.   Eyes: Negative for visual disturbance.  Respiratory: Negative for cough and chest tightness.   Gastrointestinal: Negative for blood in stool.  Genitourinary: Negative for difficulty urinating and vaginal pain.  Musculoskeletal: Negative for back pain and gait problem.  Skin: Negative for pallor and rash.  Neurological: Positive for weakness. Negative for dizziness and tremors.  Psychiatric/Behavioral: Negative for suicidal ideas, confusion and sleep disturbance. The patient is nervous/anxious.        Objective:   Physical Exam  Constitutional: She appears well-developed. No distress.       Thin   HENT:  Head: Normocephalic.  Right Ear: External ear normal.  Left Ear: External ear normal.  Nose: Nose normal.  Mouth/Throat: Oropharynx is clear and moist.  Eyes: Conjunctivae normal are normal. Pupils are equal, round, and reactive to light. Right eye exhibits no discharge. Left eye exhibits no discharge.  Neck: Normal range of motion. Neck supple. No JVD present. No tracheal deviation present. No thyromegaly present.  Cardiovascular: Normal rate, regular rhythm and normal heart sounds.   Pulmonary/Chest: No stridor. No respiratory distress. She has no  wheezes.  Abdominal: Soft. Bowel sounds are normal. She exhibits no distension and no mass. There is no tenderness. There is no rebound and no guarding.  Musculoskeletal: She exhibits no edema and no tenderness.  Lymphadenopathy:    She has no cervical adenopathy.  Neurological: She displays normal reflexes. No cranial nerve deficit. She exhibits normal muscle tone. Coordination normal.  Skin: No rash noted. No erythema.       No jaundiced  Psychiatric: She has a normal mood and affect. Her behavior is normal. Judgment and thought content normal.  Decr hearing Good skin color today  Lab Results  Component Value Date   WBC 6.0 07/08/2012   HGB 10.1* 07/08/2012   HCT 31.0* 07/08/2012   PLT 253.0 07/08/2012   GLUCOSE 90 07/08/2012   CHOL 203* 11/13/2011   TRIG 137.0 11/13/2011   HDL 82.50 11/13/2011   LDLDIRECT 101.7 11/13/2011   ALT 10 07/08/2012   ALT 10 07/08/2012   AST 20 07/08/2012   AST 20 07/08/2012   NA 140 07/08/2012   K 4.7 07/08/2012   CL 105 07/08/2012   CREATININE 1.0 07/08/2012   BUN 30* 07/08/2012   CO2 23 07/08/2012   TSH 2.15 05/25/2012   INR 1.0 04/14/2012   ERCP - stricture/stent EGD      Assessment & Plan:

## 2012-11-25 NOTE — Assessment & Plan Note (Signed)
UA

## 2012-11-27 ENCOUNTER — Telehealth: Payer: Self-pay | Admitting: *Deleted

## 2012-11-27 ENCOUNTER — Ambulatory Visit (HOSPITAL_COMMUNITY): Admit: 2012-11-27 | Payer: Self-pay | Admitting: Gastroenterology

## 2012-11-27 ENCOUNTER — Encounter (HOSPITAL_COMMUNITY): Payer: Self-pay

## 2012-11-27 SURGERY — EGD (ESOPHAGOGASTRODUODENOSCOPY)
Anesthesia: Moderate Sedation

## 2012-11-27 MED ORDER — OMEPRAZOLE 20 MG PO CPDR: 40.0000 mg | DELAYED_RELEASE_CAPSULE | Freq: Every day | ORAL | Status: DC

## 2012-11-27 NOTE — Telephone Encounter (Signed)
Done

## 2012-11-27 NOTE — Telephone Encounter (Signed)
Rec fax requesting to increase Omeprazole 20 mg to 2 qd. # 84. Ok to Rf?

## 2012-11-27 NOTE — Telephone Encounter (Signed)
Ok x 12 mo Thx 

## 2012-11-30 ENCOUNTER — Telehealth: Payer: Self-pay | Admitting: *Deleted

## 2012-11-30 NOTE — Telephone Encounter (Signed)
Lab copies mailed to pt- should she change anything she is currently doing re: low hgb? She c/o fatigue.

## 2012-12-01 NOTE — Telephone Encounter (Signed)
Pt informed

## 2012-12-01 NOTE — Telephone Encounter (Signed)
No change in plans Thx

## 2012-12-10 ENCOUNTER — Telehealth: Payer: Self-pay | Admitting: Gastroenterology

## 2012-12-10 NOTE — Telephone Encounter (Signed)
Spoke with Mr Boltz and he states he came in and filled out the [employer] form so he can have access to her health information. Pt sounded confused,but she did sign the form, she just didn't understand what it was for. Informed pt Dr Arlyce Dice was pleased with her labs and she stated understanding.

## 2012-12-30 ENCOUNTER — Telehealth: Payer: Self-pay | Admitting: *Deleted

## 2012-12-30 NOTE — Telephone Encounter (Signed)
Rf req for Diltiazem 2 % cream. Apply to perineal area twice daily. Please advise ok to Rf?

## 2012-12-30 NOTE — Telephone Encounter (Signed)
OK to fill this prescription with additional refills x5 Thank you!  

## 2012-12-31 MED ORDER — DILTIAZEM GEL 2 %: 1.0000 "application " | Freq: Two times a day (BID) | CUTANEOUS | Status: DC

## 2012-12-31 NOTE — Telephone Encounter (Signed)
Done

## 2013-01-04 ENCOUNTER — Telehealth: Payer: Self-pay | Admitting: Internal Medicine

## 2013-01-04 MED ORDER — HYDROCOD POLST-CHLORPHEN POLST 10-8 MG/5ML PO LQCR: 5.0000 mL | Freq: Two times a day (BID) | ORAL | Status: DC | PRN

## 2013-01-04 NOTE — Telephone Encounter (Signed)
OK Tussionex Thx

## 2013-01-04 NOTE — Telephone Encounter (Signed)
The patient is hoping to get a refill of cough medicine.

## 2013-01-05 ENCOUNTER — Telehealth: Payer: Self-pay | Admitting: *Deleted

## 2013-01-05 NOTE — Telephone Encounter (Signed)
Kim at pharmacy left vm stating pt usually takes Hycodan. We called in Tussionex. Did you mean to change? Tussionex is not covered and is expensive. Please advise.

## 2013-01-05 NOTE — Telephone Encounter (Signed)
Per Dr. Posey Rea- ok to give her what she has been taking. Kim at [facility] informed to fill Hycodan.

## 2013-01-05 NOTE — Telephone Encounter (Signed)
Rx phoned in. Pt informed.

## 2013-01-11 ENCOUNTER — Other Ambulatory Visit: Payer: Self-pay | Admitting: Internal Medicine

## 2013-01-22 ENCOUNTER — Telehealth (INDEPENDENT_AMBULATORY_CARE_PROVIDER_SITE_OTHER): Payer: Self-pay

## 2013-01-25 ENCOUNTER — Telehealth (INDEPENDENT_AMBULATORY_CARE_PROVIDER_SITE_OTHER): Payer: Self-pay

## 2013-01-25 NOTE — Telephone Encounter (Signed)
Spoke with pt's husband in regards to her appt scheduled for Fri, Feb 21st.  Pt's husband stated that the pt would not be able to make her appt.  We r/s fro Tuesday, March 4th @ 215p

## 2013-01-25 NOTE — Telephone Encounter (Signed)
Returned pt's call regarding her appt set for 2/21.  There was no answer or answering machine.

## 2013-01-27 ENCOUNTER — Ambulatory Visit: Payer: Medicare Other | Admitting: Internal Medicine

## 2013-01-28 ENCOUNTER — Other Ambulatory Visit: Payer: Self-pay | Admitting: Internal Medicine

## 2013-02-03 ENCOUNTER — Ambulatory Visit (INDEPENDENT_AMBULATORY_CARE_PROVIDER_SITE_OTHER): Payer: Medicare PPO | Admitting: Internal Medicine

## 2013-02-03 ENCOUNTER — Encounter: Payer: Self-pay | Admitting: Internal Medicine

## 2013-02-03 ENCOUNTER — Other Ambulatory Visit (INDEPENDENT_AMBULATORY_CARE_PROVIDER_SITE_OTHER): Payer: Medicare PPO

## 2013-02-03 VITALS — BP 120/70 | HR 76 | Temp 97.9°F | Resp 16 | Wt 91.0 lb

## 2013-02-03 DIAGNOSIS — R1310 Dysphagia, unspecified: Secondary | ICD-10-CM

## 2013-02-03 DIAGNOSIS — R630 Anorexia: Secondary | ICD-10-CM

## 2013-02-03 DIAGNOSIS — R202 Paresthesia of skin: Secondary | ICD-10-CM

## 2013-02-03 DIAGNOSIS — E039 Hypothyroidism, unspecified: Secondary | ICD-10-CM

## 2013-02-03 DIAGNOSIS — R17 Unspecified jaundice: Secondary | ICD-10-CM

## 2013-02-03 DIAGNOSIS — R61 Generalized hyperhidrosis: Secondary | ICD-10-CM

## 2013-02-03 DIAGNOSIS — D649 Anemia, unspecified: Secondary | ICD-10-CM

## 2013-02-03 DIAGNOSIS — R209 Unspecified disturbances of skin sensation: Secondary | ICD-10-CM

## 2013-02-03 DIAGNOSIS — IMO0001 Reserved for inherently not codable concepts without codable children: Secondary | ICD-10-CM

## 2013-02-03 LAB — HEPATIC FUNCTION PANEL
ALT: 17 U/L (ref 0–35)
Albumin: 3.6 g/dL (ref 3.5–5.2)
Alkaline Phosphatase: 104 U/L (ref 39–117)
Bilirubin, Direct: 0 mg/dL (ref 0.0–0.3)
Total Protein: 6.7 g/dL (ref 6.0–8.3)

## 2013-02-03 LAB — BASIC METABOLIC PANEL
BUN: 23 mg/dL (ref 6–23)
Chloride: 108 mEq/L (ref 96–112)
Creatinine, Ser: 1 mg/dL (ref 0.4–1.2)
Glucose, Bld: 104 mg/dL — ABNORMAL HIGH (ref 70–99)

## 2013-02-03 LAB — CBC WITH DIFFERENTIAL/PLATELET
Basophils Relative: 0.5 % (ref 0.0–3.0)
Eosinophils Absolute: 0.1 10*3/uL (ref 0.0–0.7)
Hemoglobin: 10 g/dL — ABNORMAL LOW (ref 12.0–15.0)
Lymphocytes Relative: 20.1 % (ref 12.0–46.0)
MCHC: 32.6 g/dL (ref 30.0–36.0)
MCV: 82.1 fl (ref 78.0–100.0)
Neutro Abs: 4.9 10*3/uL (ref 1.4–7.7)
RBC: 3.74 Mil/uL — ABNORMAL LOW (ref 3.87–5.11)

## 2013-02-03 LAB — URINALYSIS, ROUTINE W REFLEX MICROSCOPIC
Bilirubin Urine: NEGATIVE
Ketones, ur: NEGATIVE
Urine Glucose: NEGATIVE
pH: 6.5 (ref 5.0–8.0)

## 2013-02-03 LAB — VITAMIN B12: Vitamin B-12: 331 pg/mL (ref 211–911)

## 2013-02-03 NOTE — Assessment & Plan Note (Signed)
Resolved

## 2013-02-03 NOTE — Assessment & Plan Note (Signed)
Doing well now 

## 2013-02-03 NOTE — Assessment & Plan Note (Signed)
No relapse 

## 2013-02-03 NOTE — Progress Notes (Signed)
   Subjective:   HPI  C/o hot flashes - new - off and on  F/u jaundice - resolved and wt loss - better ; c/o fatigue - no change  She did have ERCP/STENT; now she is s/p EGD w/esoph dilatation  Hydroxyzine 40 mg helped her to sleep for a few hrs  F/u restless legs, legs feel cold, insomnia  Wt Readings from Last 3 Encounters:  02/03/13 91 lb (41.277 kg)  11/25/12 88 lb 6 oz (40.087 kg)  11/17/12 88 lb 9.6 oz (40.189 kg)   BP Readings from Last 3 Encounters:  02/03/13 120/70  11/25/12 120/82  11/17/12 142/80     Review of Systems  Constitutional: Positive for diaphoresis, fatigue and unexpected weight change (lost 20 lbs). Negative for chills, activity change and appetite change.  HENT: Negative for congestion, mouth sores and sinus pressure.   Eyes: Negative for visual disturbance.  Respiratory: Negative for cough and chest tightness.   Gastrointestinal: Negative for blood in stool.  Genitourinary: Negative for difficulty urinating and vaginal pain.  Musculoskeletal: Negative for back pain and gait problem.  Skin: Negative for pallor and rash.  Neurological: Positive for weakness. Negative for dizziness and tremors.  Psychiatric/Behavioral: Negative for suicidal ideas, confusion and sleep disturbance. The patient is nervous/anxious.        Objective:   Physical Exam  Constitutional: She appears well-developed. No distress.  Thin   HENT:  Head: Normocephalic.  Right Ear: External ear normal.  Left Ear: External ear normal.  Nose: Nose normal.  Mouth/Throat: Oropharynx is clear and moist.  Eyes: Conjunctivae are normal. Pupils are equal, round, and reactive to light. Right eye exhibits no discharge. Left eye exhibits no discharge.  Neck: Normal range of motion. Neck supple. No JVD present. No tracheal deviation present. No thyromegaly present.  Cardiovascular: Normal rate, regular rhythm and normal heart sounds.   Pulmonary/Chest: No stridor. No respiratory  distress. She has no wheezes.  Abdominal: Soft. Bowel sounds are normal. She exhibits no distension and no mass. There is no tenderness. There is no rebound and no guarding.  Musculoskeletal: She exhibits no edema and no tenderness.  Lymphadenopathy:    She has no cervical adenopathy.  Neurological: She displays normal reflexes. No cranial nerve deficit. She exhibits normal muscle tone. Coordination normal.  Skin: No rash noted. No erythema.  No jaundiced  Psychiatric: She has a normal mood and affect. Her behavior is normal. Judgment and thought content normal.   Good skin color today Hard hearing   Lab Results  Component Value Date   WBC 8.2 11/25/2012   HGB 9.9* 11/25/2012   HCT 30.6* 11/25/2012   PLT 268.0 11/25/2012   GLUCOSE 96 11/25/2012   CHOL 203* 11/13/2011   TRIG 137.0 11/13/2011   HDL 82.50 11/13/2011   LDLDIRECT 101.7 11/13/2011   ALT 19 11/25/2012   AST 27 11/25/2012   NA 139 11/25/2012   K 3.8 11/25/2012   CL 104 11/25/2012   CREATININE 0.8 11/25/2012   BUN 22 11/25/2012   CO2 25 11/25/2012   TSH 2.15 05/25/2012   INR 1.0 04/14/2012   ERCP - stricture/stent EGD      Assessment & Plan:

## 2013-02-03 NOTE — Assessment & Plan Note (Signed)
Labs

## 2013-02-03 NOTE — Assessment & Plan Note (Signed)
tsh

## 2013-02-03 NOTE — Assessment & Plan Note (Signed)
CBC

## 2013-02-04 ENCOUNTER — Telehealth: Payer: Self-pay | Admitting: Internal Medicine

## 2013-02-04 NOTE — Telephone Encounter (Signed)
Stacey, please, inform patient that all labs are ok Thx 

## 2013-02-05 ENCOUNTER — Ambulatory Visit (INDEPENDENT_AMBULATORY_CARE_PROVIDER_SITE_OTHER): Payer: Medicare Other | Admitting: Surgery

## 2013-02-05 NOTE — Telephone Encounter (Signed)
Pt's husband informed.

## 2013-02-08 NOTE — Telephone Encounter (Signed)
ERROR

## 2013-02-16 ENCOUNTER — Encounter (INDEPENDENT_AMBULATORY_CARE_PROVIDER_SITE_OTHER): Payer: Self-pay | Admitting: Surgery

## 2013-02-16 ENCOUNTER — Ambulatory Visit (INDEPENDENT_AMBULATORY_CARE_PROVIDER_SITE_OTHER): Payer: Medicare PPO | Admitting: Surgery

## 2013-02-16 VITALS — BP 120/82 | HR 60 | Temp 97.0°F | Resp 12 | Wt 92.0 lb

## 2013-02-16 DIAGNOSIS — R17 Unspecified jaundice: Secondary | ICD-10-CM

## 2013-02-16 DIAGNOSIS — K649 Unspecified hemorrhoids: Secondary | ICD-10-CM

## 2013-02-16 DIAGNOSIS — K648 Other hemorrhoids: Secondary | ICD-10-CM

## 2013-02-16 NOTE — Progress Notes (Signed)
Alexis Smith 79 y.o.  Body mass index is 17.39 kg/(m^2).  Patient Active Problem List  Diagnosis  . HYPOTHYROIDISM  . HYPERLIPIDEMIA  . ANXIETY DEPRESSION  . RESTLESS LEG SYNDROME  . PERIPHERAL NEUROPATHY  . ESSENTIAL HYPERTENSION, BENIGN  . RESIDUAL HEMORRHOIDAL SKIN TAGS  . GERD  . ECZEMA  . DIZZINESS, CHRONIC  . INSOMNIA  . HEADACHE  . DIVERTICULITIS, HX OF  . ANEMIA  . RHINITIS  . HYPOTENSION, ORTHOSTATIC, HX OF  . Weight loss, abnormal  . Paresthesia and pain of extremity  . Osteopenia  . Chronic UTI  . Dysphagia  . Constipation  . Hearing loss  . Jaundice  . Change in bowel function  . Obstruction of bile duct  . Loss of weight  . Anorexia  . Abdominal  pain, other specified site  . Odynophagia  . Esophageal stricture  . Irritable bladder  . Sweating    Allergies  Allergen Reactions  . Codeine Phosphate     REACTION: unspecified  . Tetanus Toxoid Adsorbed     REACTION: unspecified    Past Surgical History  Procedure Laterality Date  . Abdominal hysterectomy  1960  . Hand surgery      bilateral  . Hemorrhoid surgery    . Cataract extraction      Bilaterally  . Ercp  04/16/2012    Procedure: ENDOSCOPIC RETROGRADE CHOLANGIOPANCREATOGRAPHY (ERCP);  Surgeon: Louis Meckel, MD;  Location: [facility];  Service: Gastroenterology;  Laterality: N/A;  . Ercp    . Colonoscopy    . Breast lumpectomy      left  . Egd with dilation  06/2012    Dr. Arlyce Dice  . Esophagogastroduodenoscopy  07/28/2012    Procedure: ESOPHAGOGASTRODUODENOSCOPY (EGD);  Surgeon: Beverley Fiedler, MD;  Location: [facility];  Service: Gastroenterology;  Laterality: N/A;  balloon dilation  . Balloon dilation  07/28/2012    Procedure: BALLOON DILATION;  Surgeon: Beverley Fiedler, MD;  Location: [facility];  Service: Gastroenterology;  Laterality: N/A;   Sonda Primes, MD 1. Hemorrhoid prolapse     Mr and Mrs. Alexis Smith in today as she is having some problems with prolapsed hemorrhoids. I did a  rectal exam and anoscopy on the right side she did have a prominent internal hemorrhoid which banded. In talking with her I think she is probably having some degree of rectal prolapse particularly when she strains. I do recommend that she use MiraLAX to help her with her bowel movements in stead of Metamucil. She tolerated the banding well. I will be glad to see them whenever needed. She does not need to use the diltiazem cream on a daily basis he can having K. She develops a problem with her anall fissure.  She is the mother of Alexis Smith who is a retired Adult nurse and Alexis Smith  who is a retired Scientist, forensic.   Matt B. Daphine Deutscher, MD, [facility] [phone] beeper [phone]  02/16/2013 3:00 PM

## 2013-02-16 NOTE — Patient Instructions (Addendum)
Hemorrhoid Banding Hemorrhoids are veins in the anus and lower rectum that become enlarged. The most common symptoms are rectal bleeding, itching, and sometimes pain. Hemorrhoids might come out with straining or having a bowel movement, and they can sometimes be pushed back in. There are internal and external hemorrhoids. Only internal hemorrhoids can be treated with banding. In this procedure, a rubber band is placed near the hemorrhoid tissue, cutting off the blood supply. This procedure prevents the hemorrhoids from slipping down. LET YOUR CAREGIVER KNOW ABOUT: All medicines you are taking, especially blood thinners such as aspirin and coumadin.  RISKS AND COMPLICATIONS This is not a painful procedure, but if you do have intense pain immediately let your surgeon know because the band may need to be removed. You may have some mild pain or discomfort in the first 2 days or so after treatment. Sometimes there may be delayed bleeding in the first week after treatment.  BEFORE THE PROCEDURE  There is no special preparation needed before banding. Your surgeon may have you do an enema prior to the procedure. You will go home the same day.  HOME CARE INSTRUCTIONS   Your surgeon might instruct you to do sitz baths as needed if you have discomfort or after a bowel movement.  You may be instructed to use fiber supplements. SEEK MEDICAL CARE IF:  You have an increase in pain.  Your pain does not get better. SEEK IMMEDIATE MEDICAL CARE IF:  You have intense pain.  Fever greater than 100.5 F (38.1 C).  Bleeding that does not stop, or pus from the anus. Document Released: 09/29/2009 Document Revised: 02/24/2012 Document Reviewed: 09/29/2009 Exit[employer] Patient Information 2013 [employer].  

## 2013-02-24 ENCOUNTER — Telehealth: Payer: Self-pay

## 2013-02-24 ENCOUNTER — Telehealth: Payer: Self-pay | Admitting: *Deleted

## 2013-02-24 NOTE — Telephone Encounter (Signed)
This double dose is too high. Pls stay on 5mg /d Thx

## 2013-02-24 NOTE — Telephone Encounter (Signed)
Pt's spouse called stating pt has been experiencing worsening of RLS sxs. Spouse increased pt's Requip to 2 tabs qhs which did improve sxs. Spouse is requesting MD advisement on increasing this medication, please advise.

## 2013-02-24 NOTE — Telephone Encounter (Signed)
Rf req for Alprazolam 1 mg 1 po tid prn. # 90. Ok to Rf?

## 2013-02-24 NOTE — Telephone Encounter (Signed)
Pt's spouse advised and will monitor pt's sxs and discuss at upcoming appt as needed.

## 2013-02-24 NOTE — Telephone Encounter (Signed)
OK to fill this prescription with additional refills x4 Thank you!  

## 2013-02-25 MED ORDER — ALPRAZOLAM 1 MG PO TABS: 1.0000 mg | ORAL_TABLET | Freq: Three times a day (TID) | ORAL | Status: DC | PRN

## 2013-02-25 NOTE — Telephone Encounter (Signed)
Done

## 2013-03-04 ENCOUNTER — Ambulatory Visit (INDEPENDENT_AMBULATORY_CARE_PROVIDER_SITE_OTHER): Payer: Medicare Other | Admitting: Surgery

## 2013-03-17 ENCOUNTER — Other Ambulatory Visit: Payer: Self-pay | Admitting: Internal Medicine

## 2013-03-17 NOTE — Telephone Encounter (Signed)
Please advise Butab refill? Last RX was wrote on 11-25-12 quantity 100 with 3 refills.

## 2013-03-19 ENCOUNTER — Other Ambulatory Visit: Payer: Self-pay | Admitting: Internal Medicine

## 2013-03-25 ENCOUNTER — Telehealth: Payer: Self-pay | Admitting: Internal Medicine

## 2013-03-25 NOTE — Telephone Encounter (Signed)
Patients husband is calling because per the pharmacy they have not received the patients Fioricet, please call to [facility]

## 2013-03-25 NOTE — Telephone Encounter (Signed)
Called [facility], spoke with staff and gave verbal refills

## 2013-03-26 NOTE — Telephone Encounter (Signed)
Ok per PCP to fill with 2 Rf. Done. Pt informed

## 2013-04-14 ENCOUNTER — Telehealth: Payer: Self-pay | Admitting: Internal Medicine

## 2013-04-14 NOTE — Telephone Encounter (Signed)
The patient's husband called and wanted to see if there is anything that can be called in for the patient's restless leg syndrome.  His callback number is [phone]

## 2013-04-14 NOTE — Telephone Encounter (Signed)
i advise OV with PCP to review same given age and other medications for this chronic problem thanks

## 2013-04-14 NOTE — Telephone Encounter (Signed)
Please advise in PCPs absence.  

## 2013-04-16 NOTE — Telephone Encounter (Signed)
Pt informed/ OV scheduled 04/20/13.

## 2013-04-19 ENCOUNTER — Telehealth: Payer: Self-pay | Admitting: *Deleted

## 2013-04-19 NOTE — Telephone Encounter (Signed)
Rf req for Hydroco/Homatropine 1 tsp q 6 hr prn cough. # 240 ml. Ok to Rf?

## 2013-04-20 ENCOUNTER — Ambulatory Visit: Payer: Medicare PPO | Admitting: Internal Medicine

## 2013-04-20 MED ORDER — HYDROCODONE-HOMATROPINE 5-1.5 MG/5ML PO SYRP: 5.0000 mL | ORAL_SOLUTION | ORAL | Status: DC | PRN

## 2013-04-20 MED ORDER — HYDROCODONE-HOMATROPINE 5-1.5 MG/5ML PO SYRP: 5.0000 mL | ORAL_SOLUTION | ORAL | Status: AC | PRN

## 2013-04-20 NOTE — Telephone Encounter (Signed)
Rx printed, pending signature.  

## 2013-04-20 NOTE — Telephone Encounter (Signed)
OK to fill this prescription with additional refills x0 Thank you!  

## 2013-04-20 NOTE — Telephone Encounter (Signed)
Rx faxed to pharmacy  

## 2013-04-26 ENCOUNTER — Other Ambulatory Visit: Payer: Self-pay | Admitting: Internal Medicine

## 2013-04-26 NOTE — Telephone Encounter (Signed)
Alexis Smith is having more problems with restless leg syndrome.

## 2013-04-27 NOTE — Telephone Encounter (Signed)
D/c requip Try Gabapentin 1-2 caps at hs instead for restless legs Thx

## 2013-04-28 MED ORDER — GABAPENTIN 100 MG PO CAPS: 100.0000 mg | ORAL_CAPSULE | Freq: Every evening | ORAL | Status: DC | PRN

## 2013-04-28 NOTE — Telephone Encounter (Signed)
Pt's husband  informed of below.  

## 2013-05-04 ENCOUNTER — Encounter: Payer: Self-pay | Admitting: Internal Medicine

## 2013-05-04 ENCOUNTER — Other Ambulatory Visit (INDEPENDENT_AMBULATORY_CARE_PROVIDER_SITE_OTHER): Payer: Medicare PPO

## 2013-05-04 ENCOUNTER — Ambulatory Visit (INDEPENDENT_AMBULATORY_CARE_PROVIDER_SITE_OTHER): Payer: Medicare PPO | Admitting: Internal Medicine

## 2013-05-04 VITALS — BP 130/84 | HR 80 | Temp 98.4°F | Resp 16 | Wt 92.0 lb

## 2013-05-04 DIAGNOSIS — R630 Anorexia: Secondary | ICD-10-CM

## 2013-05-04 DIAGNOSIS — M79609 Pain in unspecified limb: Secondary | ICD-10-CM

## 2013-05-04 DIAGNOSIS — E039 Hypothyroidism, unspecified: Secondary | ICD-10-CM

## 2013-05-04 DIAGNOSIS — R634 Abnormal weight loss: Secondary | ICD-10-CM

## 2013-05-04 DIAGNOSIS — N390 Urinary tract infection, site not specified: Secondary | ICD-10-CM

## 2013-05-04 DIAGNOSIS — R209 Unspecified disturbances of skin sensation: Secondary | ICD-10-CM

## 2013-05-04 LAB — BASIC METABOLIC PANEL
Calcium: 9.3 mg/dL (ref 8.4–10.5)
GFR: 64.33 mL/min (ref >60.00)
Glucose, Bld: 83 mg/dL (ref 70–99)
Potassium: 4.9 mEq/L (ref 3.5–5.1)
Sodium: 142 mEq/L (ref 135–145)

## 2013-05-04 LAB — URINALYSIS
Bilirubin Urine: NEGATIVE
Ketones, ur: NEGATIVE
Specific Gravity, Urine: 1.025 (ref 1.000–1.030)
Total Protein, Urine: NEGATIVE
Urine Glucose: NEGATIVE
pH: 6 (ref 5.0–8.0)

## 2013-05-04 LAB — HEPATIC FUNCTION PANEL
AST: 21 U/L (ref 0–37)
Alkaline Phosphatase: 136 U/L — ABNORMAL HIGH (ref 39–117)
Bilirubin, Direct: 0 mg/dL (ref 0.0–0.3)

## 2013-05-04 MED ORDER — BUTALBITAL-APAP-CAFFEINE 50-325-40 MG PO TABS: 1.0000 | ORAL_TABLET | Freq: Two times a day (BID) | ORAL | Status: DC | PRN

## 2013-05-04 NOTE — Assessment & Plan Note (Signed)
Better  

## 2013-05-04 NOTE — Assessment & Plan Note (Signed)
Continue with current prescription therapy as reflected on the Med list.  

## 2013-05-04 NOTE — Assessment & Plan Note (Signed)
UA

## 2013-05-04 NOTE — Assessment & Plan Note (Signed)
Wt Readings from Last 3 Encounters:  05/04/13 92 lb (41.731 kg)  02/16/13 92 lb (41.731 kg)  02/03/13 91 lb (41.277 kg)

## 2013-05-04 NOTE — Progress Notes (Signed)
   Subjective:   HPI    F/u jaundice - resolved and wt loss - better ; c/o fatigue - no change  She did have ERCP/STENT; now she is s/p EGD w/esoph dilatation  Hydroxyzine 40 mg helped her to sleep for a few hrs  F/u restless legs, legs feel cold, insomnia - better on Gabapentin  Wt Readings from Last 3 Encounters:  05/04/13 92 lb (41.731 kg)  02/16/13 92 lb (41.731 kg)  02/03/13 91 lb (41.277 kg)   BP Readings from Last 3 Encounters:  05/04/13 130/84  02/16/13 120/82  02/03/13 120/70     Review of Systems  Constitutional: Positive for diaphoresis, fatigue and unexpected weight change (lost 20 lbs). Negative for chills, activity change and appetite change.  HENT: Negative for congestion, mouth sores and sinus pressure.   Eyes: Negative for visual disturbance.  Respiratory: Negative for cough and chest tightness.   Gastrointestinal: Negative for blood in stool.  Genitourinary: Negative for difficulty urinating and vaginal pain.  Musculoskeletal: Negative for back pain and gait problem.  Skin: Negative for pallor and rash.  Neurological: Positive for weakness. Negative for dizziness and tremors.  Psychiatric/Behavioral: Negative for suicidal ideas, confusion and sleep disturbance. The patient is nervous/anxious.        Objective:   Physical Exam  Constitutional: She appears well-developed. No distress.  Thin   HENT:  Head: Normocephalic.  Right Ear: External ear normal.  Left Ear: External ear normal.  Nose: Nose normal.  Mouth/Throat: Oropharynx is clear and moist.  Eyes: Conjunctivae are normal. Pupils are equal, round, and reactive to light. Right eye exhibits no discharge. Left eye exhibits no discharge.  Neck: Normal range of motion. Neck supple. No JVD present. No tracheal deviation present. No thyromegaly present.  Cardiovascular: Normal rate, regular rhythm and normal heart sounds.   Pulmonary/Chest: No stridor. No respiratory distress. She has no wheezes.   Abdominal: Soft. Bowel sounds are normal. She exhibits no distension and no mass. There is no tenderness. There is no rebound and no guarding.  Musculoskeletal: She exhibits no edema and no tenderness.  Lymphadenopathy:    She has no cervical adenopathy.  Neurological: She displays normal reflexes. No cranial nerve deficit. She exhibits normal muscle tone. Coordination normal.  Skin: No rash noted. No erythema.  No jaundiced  Psychiatric: She has a normal mood and affect. Her behavior is normal. Judgment and thought content normal.   Good skin color today Hard hearing   Lab Results  Component Value Date   WBC 6.6 02/03/2013   HGB 10.0* 02/03/2013   HCT 30.7* 02/03/2013   PLT 283.0 02/03/2013   GLUCOSE 104* 02/03/2013   CHOL 203* 11/13/2011   TRIG 137.0 11/13/2011   HDL 82.50 11/13/2011   LDLDIRECT 101.7 11/13/2011   ALT 17 02/03/2013   AST 25 02/03/2013   NA 142 02/03/2013   K 4.2 02/03/2013   CL 108 02/03/2013   CREATININE 1.0 02/03/2013   BUN 23 02/03/2013   CO2 26 02/03/2013   TSH 3.43 02/03/2013   INR 1.0 04/14/2012   ERCP - stricture/stent EGD      Assessment & Plan:

## 2013-05-04 NOTE — Assessment & Plan Note (Signed)
Doing ok.

## 2013-05-26 IMAGING — RF DG ESOPHAGUS
13 of 20 series · 15 of 24 positions shown · non-contrast
Comparison: None.

CLINICAL DATA: Abdominal pain after eating and drinking.  Weight
loss.

ESOPHOGRAM/BARIUM SWALLOW
TECHNIQUE: Combined double contrast and single contrast
examination performed using effervescent crystals, thick barium
liquid, and thin barium liquid.
Fluoroscopy time:  1.0 minutes.

[Series 1: run · 2 of 8 slices shown (1 of 13)]
[im 1/8]
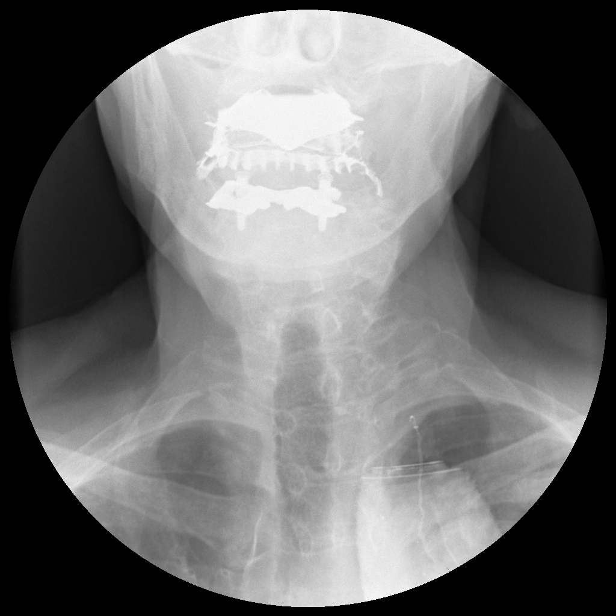
[im 8/8]
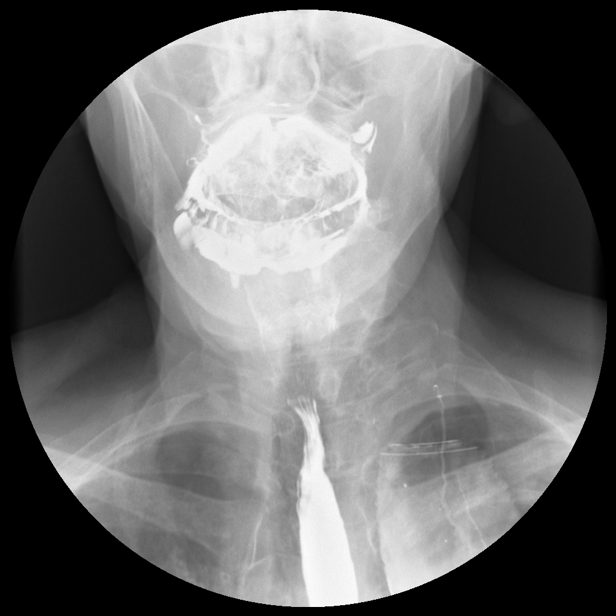

[Series 3: run · 2 of 7 slices shown (2 of 13)]
[im 1/7]
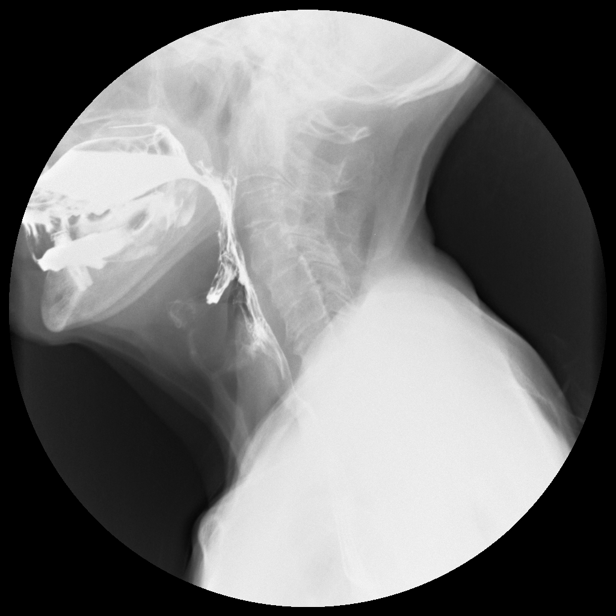
[im 4/7]
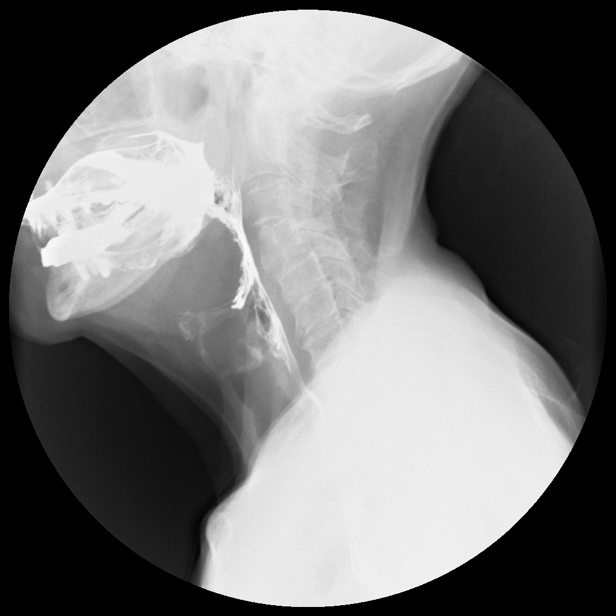

[Series 4: run · 1 of 1 slices shown (3 of 13)]
[im 1/1]
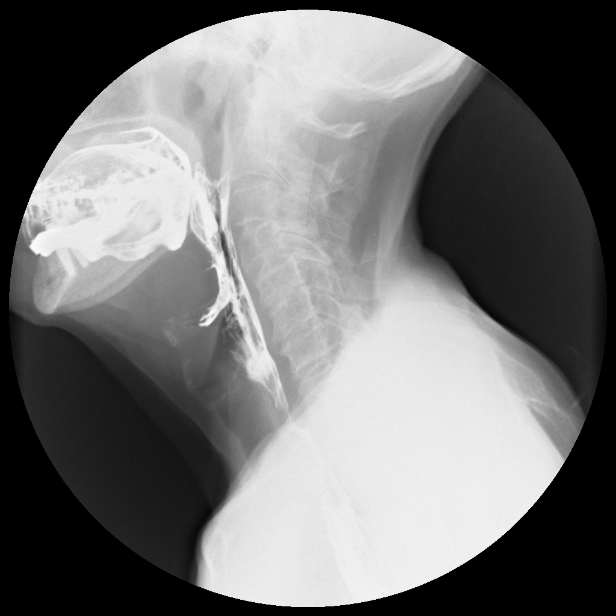

[Series 5: run · 1 of 1 slices shown (4 of 13)]
[im 1/1]
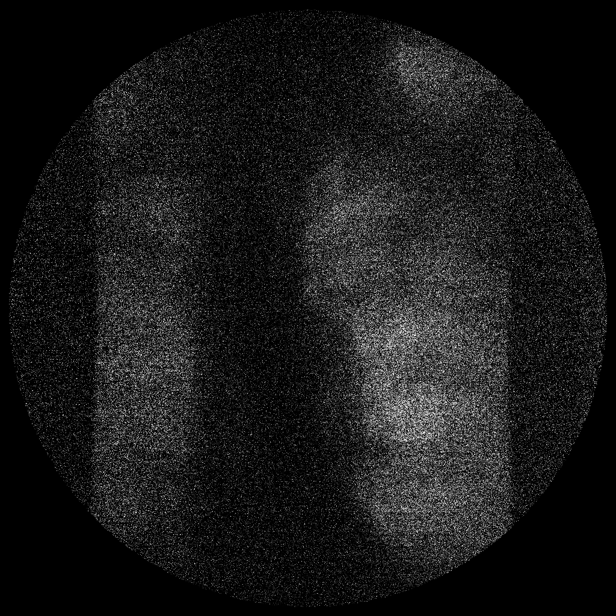

[Series 7: run · 1 of 1 slices shown (5 of 13)]
[im 1/1]
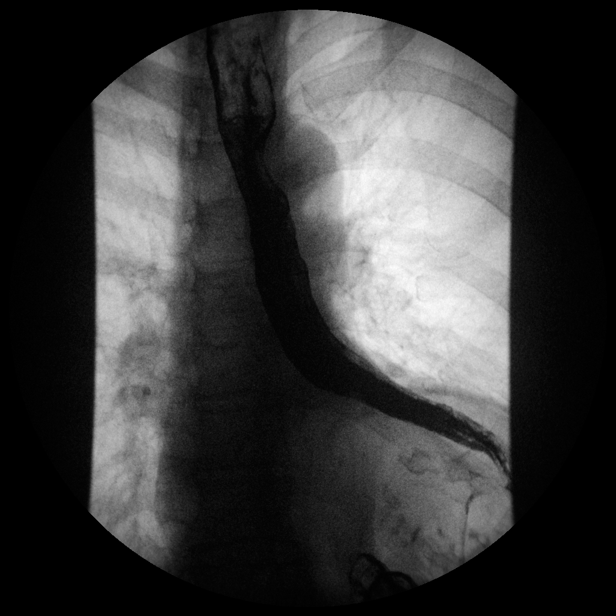

[Series 9: run · 1 of 1 slices shown (6 of 13)]
[im 1/1]
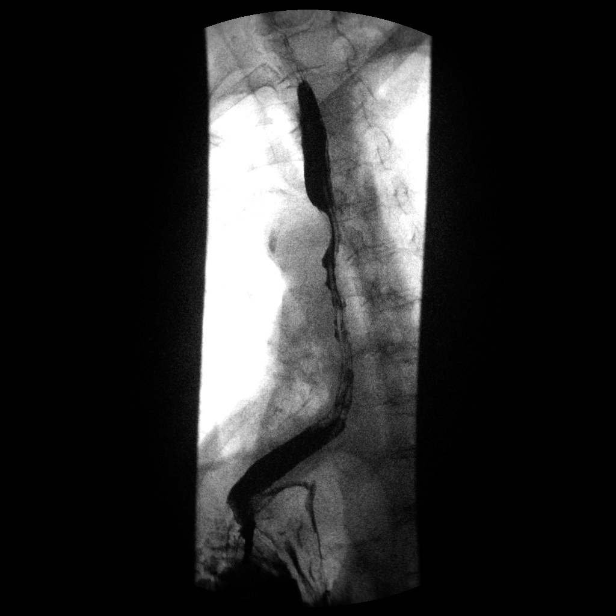

[Series 10: run · 1 of 1 slices shown (7 of 13)]
[im 1/1]
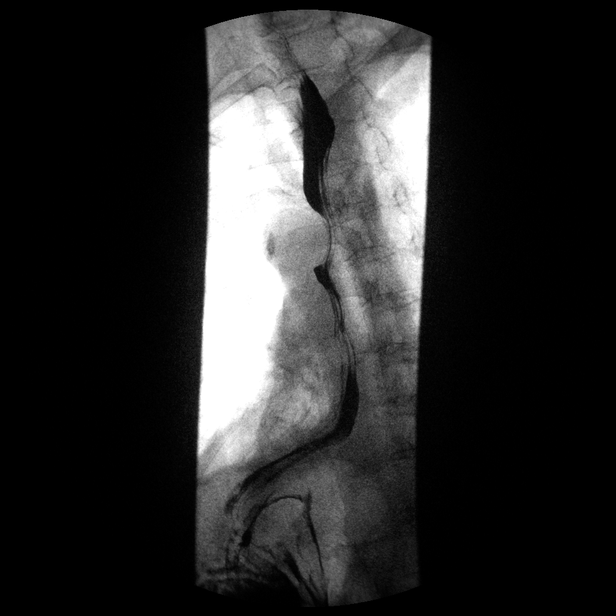

[Series 12: run · 1 of 1 slices shown (8 of 13)]
[im 1/1]
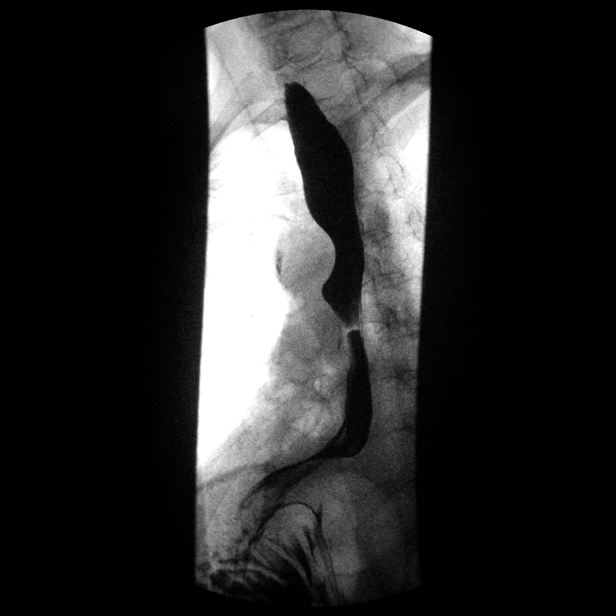

[Series 13: run · 1 of 1 slices shown (9 of 13)]
[im 1/1]
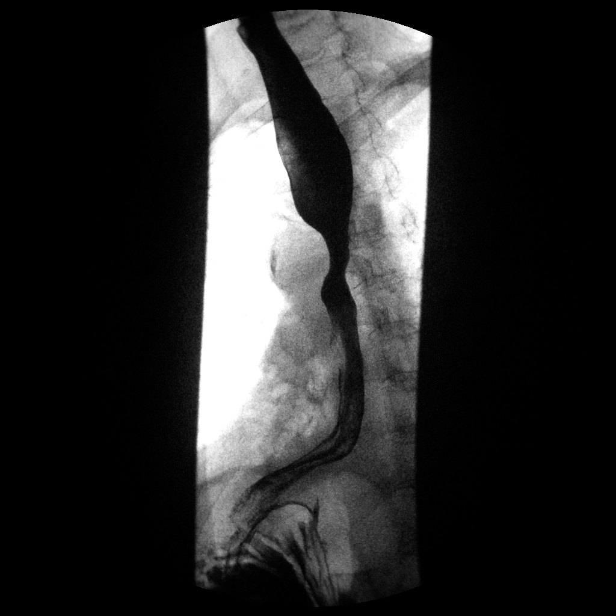

[Series 15: run · 1 of 1 slices shown (10 of 13)]
[im 1/1]
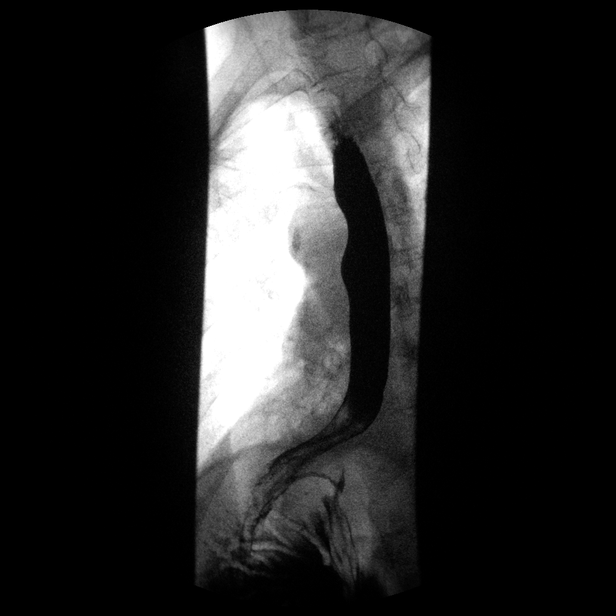

[Series 17: run · 1 of 1 slices shown (11 of 13)]
[im 1/1]
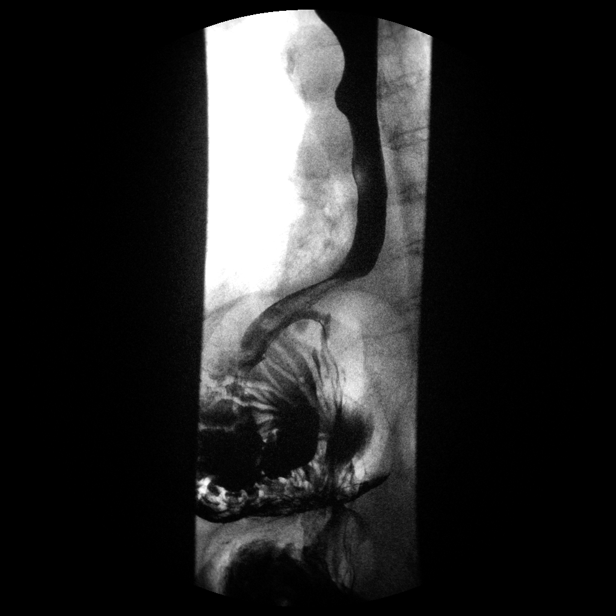

[Series 18: run · 1 of 1 slices shown (12 of 13)]
[im 1/1]
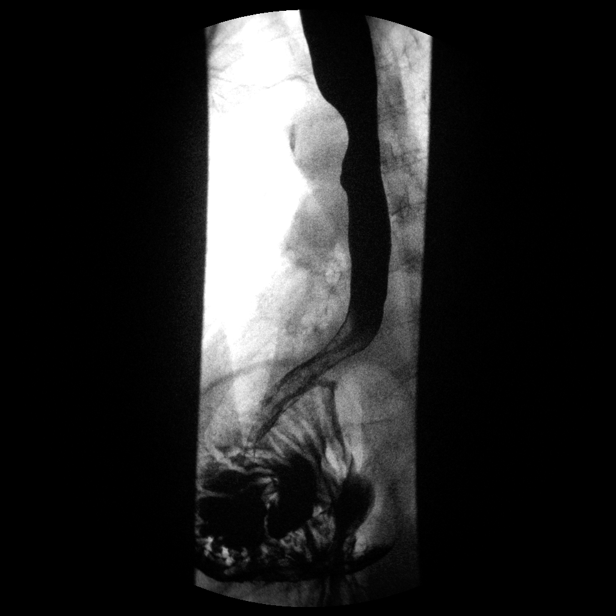

[Series 20: run · 1 of 1 slices shown (13 of 13)]
[im 1/1]
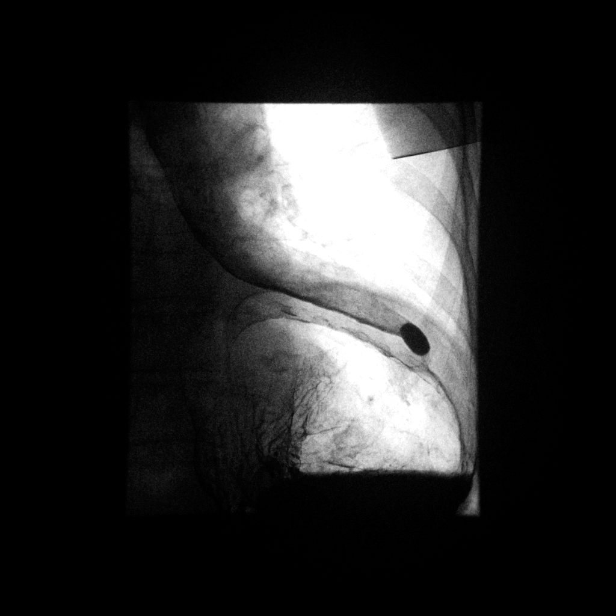

[15 of 24 positions shown; findings below may reference images not displayed]

FINDINGS: Swallowing mechanism is normal.  There is poor
esophageal peristalsis.  No definite fold thickening.  Large hiatal
hernia.  A 13 mm barium pill would not pass beyond the
gastroesophageal junction.
IMPRESSION: 1.  Esophageal dysmotility.
2.  Slight narrowing at the gastroesophageal junction, through
which a 13 mm barium pill would not pass.

## 2013-07-08 ENCOUNTER — Other Ambulatory Visit: Payer: Self-pay | Admitting: *Deleted

## 2013-07-08 DIAGNOSIS — N390 Urinary tract infection, site not specified: Secondary | ICD-10-CM

## 2013-07-09 ENCOUNTER — Other Ambulatory Visit (INDEPENDENT_AMBULATORY_CARE_PROVIDER_SITE_OTHER): Payer: Medicare PPO

## 2013-07-09 DIAGNOSIS — N390 Urinary tract infection, site not specified: Secondary | ICD-10-CM

## 2013-07-09 LAB — URINALYSIS, ROUTINE W REFLEX MICROSCOPIC
Ketones, ur: NEGATIVE
Specific Gravity, Urine: 1.01 (ref 1.000–1.030)
Total Protein, Urine: NEGATIVE
Urine Glucose: NEGATIVE
Urobilinogen, UA: 0.2 (ref 0.0–1.0)
pH: 7 (ref 5.0–8.0)

## 2013-07-13 ENCOUNTER — Other Ambulatory Visit: Payer: Self-pay | Admitting: Internal Medicine

## 2013-07-16 ENCOUNTER — Telehealth: Payer: Self-pay | Admitting: Internal Medicine

## 2013-07-16 NOTE — Telephone Encounter (Signed)
Pt received a letter from [facility].  It questioned the use of Hydroxyzine.  The letter told her there could be side effects.  Mr. Hillegass said he is not aware of any side effects she has had.  He gives her one at night to help her sleep.  Should she change medicines?

## 2013-07-16 NOTE — Telephone Encounter (Signed)
No. Pls cont hydroxyzine Thx

## 2013-07-19 NOTE — Telephone Encounter (Signed)
Pt is informed.

## 2013-07-25 ENCOUNTER — Other Ambulatory Visit: Payer: Self-pay | Admitting: Internal Medicine

## 2013-07-27 ENCOUNTER — Ambulatory Visit: Payer: Medicare PPO | Admitting: Internal Medicine

## 2013-08-02 ENCOUNTER — Telehealth (INDEPENDENT_AMBULATORY_CARE_PROVIDER_SITE_OTHER): Payer: Self-pay

## 2013-08-02 NOTE — Telephone Encounter (Signed)
The husband called to state the pt is having pain and discomfort from her hemorrhoids.  She feels like they are prolapsing.  He would like to get her worked in with Dr Daphine Deutscher.  Please call

## 2013-08-02 NOTE — Telephone Encounter (Signed)
Pts husband called again wanting to know when his wife can be seen by Dr Daphine Deutscher for her Hems. I advised him Meagen his CMA does have the msg and will call back once she has reviewed sched with Dr Daphine Deutscher.

## 2013-08-03 ENCOUNTER — Ambulatory Visit: Payer: Medicare PPO | Admitting: Internal Medicine

## 2013-08-03 ENCOUNTER — Encounter (INDEPENDENT_AMBULATORY_CARE_PROVIDER_SITE_OTHER): Payer: Self-pay | Admitting: Surgery

## 2013-08-03 ENCOUNTER — Ambulatory Visit (INDEPENDENT_AMBULATORY_CARE_PROVIDER_SITE_OTHER): Payer: Medicare PPO | Admitting: Surgery

## 2013-08-03 VITALS — BP 130/79 | HR 66 | Temp 98.0°F | Resp 14 | Ht 61.0 in | Wt 89.6 lb

## 2013-08-03 DIAGNOSIS — K648 Other hemorrhoids: Secondary | ICD-10-CM

## 2013-08-03 DIAGNOSIS — K649 Unspecified hemorrhoids: Secondary | ICD-10-CM

## 2013-08-03 NOTE — Progress Notes (Signed)
Mr. And Mrs. Tonye Royalty in today because she's been having problems with a painful bottom after she has a bowel movement. She says her hemorrhoids heard. On exam she has no external prolapse at this time. Digital rectal exam shows there to be some soft stool in the rectal vault. She does have some soft small internal hemorrhoids. She has good rectal tone and sphincter tone for her age.  I think hygiene has been a problem and I am going to recommend TUCKS.  I have also encouraged her to manually reduce her hemorrhoidal prolapse after all bowel movements. She seemed fine with that recommendation. I will be glad to see her again as needed. She just had her 89th birthday and is very nice.

## 2013-08-03 NOTE — Patient Instructions (Signed)
Witch Hazel topical solution wipes (TUCKS) What is this medicine? WITCH HAZEL [facility] hey zuhl) is a botanical astringent from the plant [facility]. The wipes and pads are used to relieve itching, burning, and irritation caused by hemorrhoids or bowel movements. They may also be used to clean the outer vaginal area after childbirth or the rectal area following rectal surgery. This medicine may be used for other purposes; ask your health care provider or pharmacist if you have questions. What should I tell my health care provider before I take this medicine? They need to know if you have any of these conditions: -bleeding in the treated area -an unusual or allergic reaction to witch hazel, other medicines, foods, dyes, or preservatives How should I use this medicine? This medicine is for external use only. Follow the directions on the package label or the advice of your doctor or health care professional. Do not use your medicine more often than directed. Talk to your pediatrician regarding the use of this medicine in children. While this drug may be used for children as young as 12 years for selected conditions, precautions do apply. Overdosage: If you think you've taken too much of this medicine contact a poison control center or emergency room at once. Overdosage: If you think you have taken too much of this medicine contact a poison control center or emergency room at once. NOTE: This medicine is only for you. Do not share this medicine with others. What if I miss a dose? This does not apply; the pads or wipes may be used as needed, up to 6 times per day. What may interact with this medicine? Interactions are not expected. Do not use any other skin products on the same area of skin without asking your doctor or health care professional. This list may not describe all possible interactions. Give your health care provider a list of all the medicines, herbs, non-prescription drugs, or  dietary supplements you use. Also tell them if you smoke, drink alcohol, or use illegal drugs. Some items may interact with your medicine. What should I watch for while using this medicine? Tell your doctor or healthcare professional if your symptoms do not start to get better or if they get worse. Many pads and wipes are safe for septic and sewer system disposal. Check specific product label. What side effects may I notice from receiving this medicine? Side effects that you should report to your doctor or health care professional as soon as possible: -allergic reactions like skin rash, itching or hives, swelling of the face, lips, or tongue  Side effects that usually do not require medical attention (Report these to your doctor or health care professional if they continue or are bothersome.): -mild skin dryness or irritation This list may not describe all possible side effects. Call your doctor for medical advice about side effects. You may report side effects to FDA at 1-800-FDA-1088. Where should I keep my medicine? Keep out of the reach of children. Store at room temperature between 15 and 30 degrees C (59 and 86 degrees F). Throw away any unused medicine after the expiration date. NOTE: This sheet is a summary. It may not cover all possible information. If you have questions about this medicine, talk to your doctor, pharmacist, or health care provider.  2013, [employer]. (08/02/2010 1:23:17 PM)

## 2013-08-17 ENCOUNTER — Telehealth: Payer: Self-pay | Admitting: *Deleted

## 2013-08-17 NOTE — Telephone Encounter (Signed)
Rf req for Alprazolam 1 mg 1 po tid prn anxiety. # 90. Last filled 07/08/13. Ok to Rf?

## 2013-08-17 NOTE — Telephone Encounter (Signed)
OK to fill this prescription with additional refills x2 Thank you!  

## 2013-08-18 MED ORDER — ALPRAZOLAM 1 MG PO TABS: 1.0000 mg | ORAL_TABLET | Freq: Three times a day (TID) | ORAL | Status: DC | PRN

## 2013-08-18 NOTE — Telephone Encounter (Signed)
Done

## 2013-08-24 ENCOUNTER — Other Ambulatory Visit: Payer: Self-pay | Admitting: Internal Medicine

## 2013-08-25 ENCOUNTER — Ambulatory Visit (INDEPENDENT_AMBULATORY_CARE_PROVIDER_SITE_OTHER): Payer: Medicare PPO | Admitting: Internal Medicine

## 2013-08-25 ENCOUNTER — Encounter: Payer: Self-pay | Admitting: Internal Medicine

## 2013-08-25 ENCOUNTER — Other Ambulatory Visit (INDEPENDENT_AMBULATORY_CARE_PROVIDER_SITE_OTHER): Payer: Medicare PPO

## 2013-08-25 VITALS — BP 130/90 | HR 80 | Temp 98.1°F | Resp 16 | Wt 92.0 lb

## 2013-08-25 DIAGNOSIS — R17 Unspecified jaundice: Secondary | ICD-10-CM

## 2013-08-25 DIAGNOSIS — Z23 Encounter for immunization: Secondary | ICD-10-CM

## 2013-08-25 DIAGNOSIS — I10 Essential (primary) hypertension: Secondary | ICD-10-CM

## 2013-08-25 DIAGNOSIS — R630 Anorexia: Secondary | ICD-10-CM

## 2013-08-25 DIAGNOSIS — E785 Hyperlipidemia, unspecified: Secondary | ICD-10-CM

## 2013-08-25 DIAGNOSIS — F341 Dysthymic disorder: Secondary | ICD-10-CM

## 2013-08-25 DIAGNOSIS — R634 Abnormal weight loss: Secondary | ICD-10-CM

## 2013-08-25 DIAGNOSIS — E039 Hypothyroidism, unspecified: Secondary | ICD-10-CM

## 2013-08-25 LAB — CBC WITH DIFFERENTIAL/PLATELET
Eosinophils Relative: 1.8 % (ref 0.0–5.0)
HCT: 35.9 % — ABNORMAL LOW (ref 36.0–46.0)
Hemoglobin: 11.8 g/dL — ABNORMAL LOW (ref 12.0–15.0)
Lymphocytes Relative: 19.7 % (ref 12.0–46.0)
Lymphs Abs: 1.4 10*3/uL (ref 0.7–4.0)
Monocytes Relative: 5.8 % (ref 3.0–12.0)
Neutro Abs: 5.1 10*3/uL (ref 1.4–7.7)
WBC: 7 10*3/uL (ref 4.5–10.5)

## 2013-08-25 LAB — BASIC METABOLIC PANEL
CO2: 29 mEq/L (ref 19–32)
Calcium: 9.1 mg/dL (ref 8.4–10.5)
Chloride: 101 mEq/L (ref 96–112)
Creatinine, Ser: 0.9 mg/dL (ref 0.4–1.2)
Glucose, Bld: 79 mg/dL (ref 70–99)
Sodium: 137 mEq/L (ref 135–145)

## 2013-08-25 LAB — URINALYSIS, ROUTINE W REFLEX MICROSCOPIC
Bilirubin Urine: NEGATIVE
Ketones, ur: NEGATIVE
Nitrite: NEGATIVE
pH: 6 (ref 5.0–8.0)

## 2013-08-25 LAB — HEPATIC FUNCTION PANEL
ALT: 11 U/L (ref 0–35)
Albumin: 4 g/dL (ref 3.5–5.2)
Bilirubin, Direct: 0 mg/dL (ref 0.0–0.3)
Total Protein: 7.5 g/dL (ref 6.0–8.3)

## 2013-08-25 LAB — PROTIME-INR
INR: 1 ratio (ref 0.8–1.0)
Prothrombin Time: 10.5 s (ref 10.2–12.4)

## 2013-08-25 MED ORDER — BUTALBITAL-APAP-CAFFEINE 50-325-40 MG PO TABS: 1.0000 | ORAL_TABLET | Freq: Three times a day (TID) | ORAL | Status: DC | PRN

## 2013-08-25 NOTE — Assessment & Plan Note (Signed)
Better  

## 2013-08-25 NOTE — Progress Notes (Signed)
   Subjective:   HPI  C/o more pain - fell a few days ago....  F/u jaundice - resolved and wt loss - better ; c/o fatigue - no change  She did have ERCP/STENT; now she is s/p EGD w/esoph dilatation  Hydroxyzine 40 mg helped her to sleep for a few hrs  F/u restless legs, legs feel cold, insomnia - better on Gabapentin  Wt Readings from Last 3 Encounters:  08/25/13 92 lb (41.731 kg)  08/03/13 89 lb 9.6 oz (40.642 kg)  05/04/13 92 lb (41.731 kg)   BP Readings from Last 3 Encounters:  08/25/13 130/90  08/03/13 130/79  05/04/13 130/84     Review of Systems  Constitutional: Positive for diaphoresis, fatigue and unexpected weight change (lost 20 lbs). Negative for chills, activity change and appetite change.  HENT: Negative for congestion, mouth sores and sinus pressure.   Eyes: Negative for visual disturbance.  Respiratory: Negative for cough and chest tightness.   Gastrointestinal: Negative for blood in stool.  Genitourinary: Negative for difficulty urinating and vaginal pain.  Musculoskeletal: Negative for back pain and gait problem.  Skin: Negative for pallor and rash.  Neurological: Positive for weakness. Negative for dizziness and tremors.  Psychiatric/Behavioral: Negative for suicidal ideas, confusion and sleep disturbance. The patient is nervous/anxious.        Objective:   Physical Exam  Constitutional: She appears well-developed. No distress.  Thin   HENT:  Head: Normocephalic.  Right Ear: External ear normal.  Left Ear: External ear normal.  Nose: Nose normal.  Mouth/Throat: Oropharynx is clear and moist.  Eyes: Conjunctivae are normal. Pupils are equal, round, and reactive to light. Right eye exhibits no discharge. Left eye exhibits no discharge.  Neck: Normal range of motion. Neck supple. No JVD present. No tracheal deviation present. No thyromegaly present.  Cardiovascular: Normal rate, regular rhythm and normal heart sounds.   Pulmonary/Chest: No  stridor. No respiratory distress. She has no wheezes.  Abdominal: Soft. Bowel sounds are normal. She exhibits no distension and no mass. There is no tenderness. There is no rebound and no guarding.  Musculoskeletal: She exhibits no edema and no tenderness.  Lymphadenopathy:    She has no cervical adenopathy.  Neurological: She displays normal reflexes. No cranial nerve deficit. She exhibits normal muscle tone. Coordination normal.  Skin: No rash noted. No erythema.  No jaundiced  Psychiatric: She has a normal mood and affect. Her behavior is normal. Judgment and thought content normal.  Cane Good skin color today Hard hearing   Lab Results  Component Value Date   WBC 6.6 02/03/2013   HGB 10.0* 02/03/2013   HCT 30.7* 02/03/2013   PLT 283.0 02/03/2013   GLUCOSE 83 05/04/2013   CHOL 203* 11/13/2011   TRIG 137.0 11/13/2011   HDL 82.50 11/13/2011   LDLDIRECT 101.7 11/13/2011   ALT 12 05/04/2013   AST 21 05/04/2013   NA 142 05/04/2013   K 4.9 05/04/2013   CL 106 05/04/2013   CREATININE 0.9 05/04/2013   BUN 22 05/04/2013   CO2 27 05/04/2013   TSH 3.43 02/03/2013   INR 1.0 04/14/2012   ERCP - stricture/stent EGD      Assessment & Plan:

## 2013-08-25 NOTE — Assessment & Plan Note (Signed)
No relapse 

## 2013-08-25 NOTE — Assessment & Plan Note (Signed)
Continue with current prescription therapy as reflected on the Med list.  

## 2013-08-25 NOTE — Assessment & Plan Note (Signed)
Wt Readings from Last 3 Encounters:  08/25/13 92 lb (41.731 kg)  08/03/13 89 lb 9.6 oz (40.642 kg)  05/04/13 92 lb (41.731 kg)

## 2013-08-25 NOTE — Assessment & Plan Note (Signed)
Resolved

## 2013-08-26 NOTE — Assessment & Plan Note (Signed)
Continue with current prescription therapy as reflected on the Med list.  

## 2013-11-17 ENCOUNTER — Other Ambulatory Visit: Payer: Self-pay | Admitting: Internal Medicine

## 2013-11-22 ENCOUNTER — Other Ambulatory Visit: Payer: Self-pay | Admitting: Internal Medicine

## 2013-11-22 NOTE — Telephone Encounter (Signed)
Refill done.  

## 2013-11-23 ENCOUNTER — Ambulatory Visit (INDEPENDENT_AMBULATORY_CARE_PROVIDER_SITE_OTHER): Payer: Medicare PPO | Admitting: Internal Medicine

## 2013-11-23 ENCOUNTER — Encounter: Payer: Self-pay | Admitting: Internal Medicine

## 2013-11-23 ENCOUNTER — Other Ambulatory Visit (INDEPENDENT_AMBULATORY_CARE_PROVIDER_SITE_OTHER): Payer: Medicare PPO

## 2013-11-23 VITALS — BP 130/90 | HR 80 | Temp 97.1°F | Resp 16 | Wt 87.0 lb

## 2013-11-23 DIAGNOSIS — E039 Hypothyroidism, unspecified: Secondary | ICD-10-CM

## 2013-11-23 DIAGNOSIS — R634 Abnormal weight loss: Secondary | ICD-10-CM

## 2013-11-23 DIAGNOSIS — R109 Unspecified abdominal pain: Secondary | ICD-10-CM

## 2013-11-23 DIAGNOSIS — R17 Unspecified jaundice: Secondary | ICD-10-CM

## 2013-11-23 DIAGNOSIS — Z8679 Personal history of other diseases of the circulatory system: Secondary | ICD-10-CM

## 2013-11-23 LAB — CBC WITH DIFFERENTIAL/PLATELET
Basophils Absolute: 0 10*3/uL (ref 0.0–0.1)
Basophils Relative: 0.2 % (ref 0.0–3.0)
Eosinophils Relative: 1.4 % (ref 0.0–5.0)
HCT: 36.6 % (ref 36.0–46.0)
Hemoglobin: 12.1 g/dL (ref 12.0–15.0)
Lymphs Abs: 1.2 10*3/uL (ref 0.7–4.0)
Monocytes Relative: 4.5 % (ref 3.0–12.0)
Neutro Abs: 5.5 10*3/uL (ref 1.4–7.7)
Neutrophils Relative %: 76.5 % (ref 43.0–77.0)
RBC: 4.17 Mil/uL (ref 3.87–5.11)
RDW: 16.3 % — ABNORMAL HIGH (ref 11.5–14.6)

## 2013-11-23 LAB — BASIC METABOLIC PANEL
BUN: 24 mg/dL — ABNORMAL HIGH (ref 6–23)
CO2: 26 mEq/L (ref 19–32)
Calcium: 9 mg/dL (ref 8.4–10.5)
Creatinine, Ser: 1 mg/dL (ref 0.4–1.2)
Glucose, Bld: 102 mg/dL — ABNORMAL HIGH (ref 70–99)
Sodium: 137 mEq/L (ref 135–145)

## 2013-11-23 LAB — HEPATIC FUNCTION PANEL
AST: 19 U/L (ref 0–37)
Albumin: 3.9 g/dL (ref 3.5–5.2)
Alkaline Phosphatase: 137 U/L — ABNORMAL HIGH (ref 39–117)
Bilirubin, Direct: 0 mg/dL (ref 0.0–0.3)
Total Protein: 7.3 g/dL (ref 6.0–8.3)

## 2013-11-23 MED ORDER — GABAPENTIN 100 MG PO CAPS: 100.0000 mg | ORAL_CAPSULE | Freq: Every evening | ORAL | Status: DC | PRN

## 2013-11-23 MED ORDER — LEVOTHYROXINE SODIUM 50 MCG PO TABS: ORAL_TABLET | ORAL | Status: DC

## 2013-11-23 MED ORDER — BUTALBITAL-APAP-CAFFEINE 50-325-40 MG PO TABS: 1.0000 | ORAL_TABLET | Freq: Three times a day (TID) | ORAL | Status: DC | PRN

## 2013-11-23 MED ORDER — ALPRAZOLAM 1 MG PO TABS: 1.0000 mg | ORAL_TABLET | Freq: Three times a day (TID) | ORAL | Status: DC | PRN

## 2013-11-23 MED ORDER — OMEPRAZOLE 20 MG PO CPDR: 20.0000 mg | DELAYED_RELEASE_CAPSULE | Freq: Every day | ORAL | Status: DC

## 2013-11-23 NOTE — Progress Notes (Signed)
Pre visit review using our clinic review tool, if applicable. No additional management support is needed unless otherwise documented below in the visit note. 

## 2013-11-23 NOTE — Assessment & Plan Note (Signed)
Better  Labs 

## 2013-11-23 NOTE — Assessment & Plan Note (Signed)
Continue with current prescription therapy as reflected on the Med list.  

## 2013-11-23 NOTE — Assessment & Plan Note (Signed)
Watching wt

## 2013-11-23 NOTE — Progress Notes (Signed)
   Subjective:   HPI   F/u jaundice - resolved and wt loss - better ; c/o fatigue - no change  She did have ERCP/STENT; now she is s/p EGD w/esoph dilatation  Hydroxyzine 40 mg helped her to sleep for a few hrs  F/u restless legs, legs feel cold, insomnia - better on Gabapentin  Wt Readings from Last 3 Encounters:  11/23/13 87 lb (39.463 kg)  08/25/13 92 lb (41.731 kg)  08/03/13 89 lb 9.6 oz (40.642 kg)   BP Readings from Last 3 Encounters:  11/23/13 130/90  08/25/13 130/90  08/03/13 130/79     Review of Systems  Constitutional: Positive for diaphoresis, fatigue and unexpected weight change (lost 5 lbs). Negative for chills, activity change and appetite change.  HENT: Negative for congestion, mouth sores and sinus pressure.   Eyes: Negative for visual disturbance.  Respiratory: Negative for cough and chest tightness.   Gastrointestinal: Negative for blood in stool.  Genitourinary: Negative for difficulty urinating and vaginal pain.  Musculoskeletal: Negative for back pain and gait problem.  Skin: Negative for pallor and rash.  Neurological: Positive for weakness. Negative for dizziness and tremors.  Psychiatric/Behavioral: Negative for suicidal ideas, confusion and sleep disturbance. The patient is nervous/anxious.        Objective:   Physical Exam  Constitutional: She appears well-developed. No distress.  Thin   HENT:  Head: Normocephalic.  Right Ear: External ear normal.  Left Ear: External ear normal.  Nose: Nose normal.  Mouth/Throat: Oropharynx is clear and moist.  Eyes: Conjunctivae are normal. Pupils are equal, round, and reactive to light. Right eye exhibits no discharge. Left eye exhibits no discharge.  Neck: Normal range of motion. Neck supple. No JVD present. No tracheal deviation present. No thyromegaly present.  Cardiovascular: Normal rate, regular rhythm and normal heart sounds.   Pulmonary/Chest: No stridor. No respiratory distress. She has no  wheezes.  Abdominal: Soft. Bowel sounds are normal. She exhibits no distension and no mass. There is no tenderness. There is no rebound and no guarding.  Musculoskeletal: She exhibits no edema and no tenderness.  Lymphadenopathy:    She has no cervical adenopathy.  Neurological: She displays normal reflexes. No cranial nerve deficit. She exhibits normal muscle tone. Coordination normal.  Skin: No rash noted. No erythema.  No jaundiced  Psychiatric: She has a normal mood and affect. Her behavior is normal. Judgment and thought content normal.  Cane Good skin color today Hard hearing   Lab Results  Component Value Date   WBC 7.0 08/25/2013   HGB 11.8* 08/25/2013   HCT 35.9* 08/25/2013   PLT 261.0 08/25/2013   GLUCOSE 79 08/25/2013   CHOL 203* 11/13/2011   TRIG 137.0 11/13/2011   HDL 82.50 11/13/2011   LDLDIRECT 101.7 11/13/2011   ALT 11 08/25/2013   AST 18 08/25/2013   NA 137 08/25/2013   K 4.7 08/25/2013   CL 101 08/25/2013   CREATININE 0.9 08/25/2013   BUN 20 08/25/2013   CO2 29 08/25/2013   TSH 3.43 02/03/2013   INR 1.0 08/25/2013   ERCP - stricture/stent EGD      Assessment & Plan:

## 2013-11-23 NOTE — Assessment & Plan Note (Signed)
No relapse Labs 

## 2013-11-25 ENCOUNTER — Encounter: Payer: Self-pay | Admitting: Internal Medicine

## 2013-12-07 ENCOUNTER — Encounter: Payer: Self-pay | Admitting: Internal Medicine

## 2013-12-17 ENCOUNTER — Telehealth: Payer: Self-pay | Admitting: *Deleted

## 2013-12-17 NOTE — Telephone Encounter (Signed)
I called [employer] to initiate PA for generic Fioricet and hydroxyzine. PA forms are being faxed for completion.

## 2013-12-27 ENCOUNTER — Telehealth: Payer: Self-pay | Admitting: *Deleted

## 2013-12-27 NOTE — Telephone Encounter (Signed)
Faxed received from [facility] stating that the [place] for Fiorcet 50-325-40 has been denied. Please advise.

## 2013-12-27 NOTE — Telephone Encounter (Signed)
Pt made aware

## 2013-12-27 NOTE — Telephone Encounter (Signed)
Noted Pls inform pt Thx

## 2013-12-28 ENCOUNTER — Telehealth: Payer: Self-pay | Admitting: *Deleted

## 2013-12-28 NOTE — Telephone Encounter (Signed)
Hydroxyzine 50 mg tablet PA has been approved until 01/14/2015. Pt's spouse approved. Pt's spouse wants to know if there is a covered alternative for the fioricet since it has been denied by insurance. Please advise.

## 2013-12-30 NOTE — Telephone Encounter (Signed)
There is no identical med Try Tylenol Thx

## 2013-12-30 NOTE — Telephone Encounter (Signed)
Pt's husband informed.

## 2014-01-04 ENCOUNTER — Telehealth: Payer: Self-pay | Admitting: Internal Medicine

## 2014-01-04 NOTE — Telephone Encounter (Signed)
Did Insurance suggest any alternatives? If not - they imply that she has to pay for these meds herself. OK PA  Sorry!

## 2014-01-04 NOTE — Telephone Encounter (Signed)
Alexis Smith has [employer].  Alprazolam and Butalbital have been removed from [employer]'s list.  Would this be a PA or will she need another medication to replace.

## 2014-01-13 NOTE — Telephone Encounter (Signed)
I spoke to pt's spouse--he states he received word from [facility] that pt's meds were covered.

## 2014-01-19 ENCOUNTER — Ambulatory Visit: Payer: Medicare PPO | Admitting: Internal Medicine

## 2014-01-21 ENCOUNTER — Other Ambulatory Visit: Payer: Self-pay | Admitting: Internal Medicine

## 2014-01-23 ENCOUNTER — Other Ambulatory Visit: Payer: Self-pay | Admitting: Internal Medicine

## 2014-01-25 NOTE — Telephone Encounter (Signed)
Please advise refill? Pt has not had rx filled since 10/19/2011.

## 2014-03-04 ENCOUNTER — Telehealth: Payer: Self-pay | Admitting: Internal Medicine

## 2014-03-04 DIAGNOSIS — N390 Urinary tract infection, site not specified: Secondary | ICD-10-CM

## 2014-03-04 NOTE — Telephone Encounter (Signed)
Order was placed.

## 2014-03-04 NOTE — Telephone Encounter (Signed)
Pilar Plate wants to know if he can bring a urine sample from [place] to the lab today for her appt on Monday .

## 2014-03-04 NOTE — Telephone Encounter (Signed)
Ok Thx 

## 2014-03-04 NOTE — Telephone Encounter (Signed)
Pt is informed.

## 2014-03-07 ENCOUNTER — Encounter: Payer: Self-pay | Admitting: Internal Medicine

## 2014-03-07 ENCOUNTER — Other Ambulatory Visit (INDEPENDENT_AMBULATORY_CARE_PROVIDER_SITE_OTHER): Payer: Medicare PPO

## 2014-03-07 ENCOUNTER — Ambulatory Visit (INDEPENDENT_AMBULATORY_CARE_PROVIDER_SITE_OTHER): Payer: Medicare PPO | Admitting: Internal Medicine

## 2014-03-07 VITALS — BP 128/88 | HR 94 | Temp 98.5°F | Resp 16 | Ht 61.0 in | Wt 87.0 lb

## 2014-03-07 DIAGNOSIS — R17 Unspecified jaundice: Secondary | ICD-10-CM

## 2014-03-07 DIAGNOSIS — N390 Urinary tract infection, site not specified: Secondary | ICD-10-CM

## 2014-03-07 DIAGNOSIS — R109 Unspecified abdominal pain: Secondary | ICD-10-CM

## 2014-03-07 DIAGNOSIS — F341 Dysthymic disorder: Secondary | ICD-10-CM

## 2014-03-07 DIAGNOSIS — R630 Anorexia: Secondary | ICD-10-CM

## 2014-03-07 LAB — URINALYSIS, ROUTINE W REFLEX MICROSCOPIC
Bilirubin Urine: NEGATIVE
HGB URINE DIPSTICK: NEGATIVE
Ketones, ur: NEGATIVE
Nitrite: NEGATIVE
Specific Gravity, Urine: 1.01 (ref 1.000–1.030)
Total Protein, Urine: NEGATIVE
URINE GLUCOSE: NEGATIVE
Urobilinogen, UA: 0.2 (ref 0.0–1.0)
pH: 6.5 (ref 5.0–8.0)

## 2014-03-07 LAB — HEPATIC FUNCTION PANEL
ALBUMIN: 4.1 g/dL (ref 3.5–5.2)
ALK PHOS: 120 U/L — AB (ref 39–117)
ALT: 9 U/L (ref 0–35)
AST: 22 U/L (ref 0–37)
BILIRUBIN DIRECT: 0.1 mg/dL (ref 0.0–0.3)
TOTAL PROTEIN: 7.1 g/dL (ref 6.0–8.3)
Total Bilirubin: 0.5 mg/dL (ref 0.3–1.2)

## 2014-03-07 LAB — CBC WITH DIFFERENTIAL/PLATELET
BASOS ABS: 0 10*3/uL (ref 0.0–0.1)
Basophils Relative: 0.7 % (ref 0.0–3.0)
Eosinophils Absolute: 0 10*3/uL (ref 0.0–0.7)
Eosinophils Relative: 0.4 % (ref 0.0–5.0)
HCT: 36 % (ref 36.0–46.0)
Hemoglobin: 11.9 g/dL — ABNORMAL LOW (ref 12.0–15.0)
LYMPHS PCT: 16.4 % (ref 12.0–46.0)
Lymphs Abs: 1 10*3/uL (ref 0.7–4.0)
MCHC: 33 g/dL (ref 30.0–36.0)
MCV: 90.6 fl (ref 78.0–100.0)
Monocytes Absolute: 0.3 10*3/uL (ref 0.1–1.0)
Monocytes Relative: 4.8 % (ref 3.0–12.0)
NEUTROS PCT: 77.7 % — AB (ref 43.0–77.0)
Neutro Abs: 4.8 10*3/uL (ref 1.4–7.7)
PLATELETS: 247 10*3/uL (ref 150.0–400.0)
RBC: 3.98 Mil/uL (ref 3.87–5.11)
RDW: 14.1 % (ref 11.5–14.6)
WBC: 6.2 10*3/uL (ref 4.5–10.5)

## 2014-03-07 LAB — BASIC METABOLIC PANEL
BUN: 17 mg/dL (ref 6–23)
CALCIUM: 9.3 mg/dL (ref 8.4–10.5)
CO2: 24 meq/L (ref 19–32)
Chloride: 104 mEq/L (ref 96–112)
Creatinine, Ser: 0.9 mg/dL (ref 0.4–1.2)
GFR: 61.75 mL/min (ref >60.00)
Glucose, Bld: 85 mg/dL (ref 70–99)
POTASSIUM: 4.1 meq/L (ref 3.5–5.1)
SODIUM: 138 meq/L (ref 135–145)

## 2014-03-07 MED ORDER — MEGESTROL ACETATE 625 MG/5ML PO SUSP: 625.0000 mg | Freq: Every day | ORAL | Status: DC

## 2014-03-07 MED ORDER — CIPROFLOXACIN HCL 250 MG PO TABS: 250.0000 mg | ORAL_TABLET | Freq: Every day | ORAL | Status: DC

## 2014-03-07 NOTE — Assessment & Plan Note (Signed)
No change 

## 2014-03-07 NOTE — Assessment & Plan Note (Signed)
Labs

## 2014-03-07 NOTE — Assessment & Plan Note (Signed)
UA

## 2014-03-07 NOTE — Progress Notes (Signed)
   Subjective:   HPI L/o L side pain and urinary symptoms  F/u jaundice - resolved and wt loss - better ; c/o fatigue - no change  She did have ERCP/STENT; now she is s/p EGD w/esoph dilatation  Hydroxyzine 40 mg helped her to sleep for a few hrs  F/u restless legs, legs feel cold, insomnia - better on Gabapentin  Wt Readings from Last 3 Encounters:  03/07/14 87 lb (39.463 kg)  11/23/13 87 lb (39.463 kg)  08/25/13 92 lb (41.731 kg)   BP Readings from Last 3 Encounters:  03/07/14 128/88  11/23/13 130/90  08/25/13 130/90     Review of Systems  Constitutional: Positive for diaphoresis, fatigue and unexpected weight change (lost 5 lbs). Negative for chills, activity change and appetite change.  HENT: Negative for congestion, mouth sores and sinus pressure.   Eyes: Negative for visual disturbance.  Respiratory: Negative for cough and chest tightness.   Gastrointestinal: Negative for blood in stool.  Genitourinary: Negative for difficulty urinating and vaginal pain.  Musculoskeletal: Negative for back pain and gait problem.  Skin: Negative for pallor and rash.  Neurological: Positive for weakness. Negative for dizziness and tremors.  Psychiatric/Behavioral: Negative for suicidal ideas, confusion and sleep disturbance. The patient is nervous/anxious.        Objective:   Physical Exam  Constitutional: She appears well-developed. No distress.  Thin   HENT:  Head: Normocephalic.  Right Ear: External ear normal.  Left Ear: External ear normal.  Nose: Nose normal.  Mouth/Throat: Oropharynx is clear and moist.  Eyes: Conjunctivae are normal. Pupils are equal, round, and reactive to light. Right eye exhibits no discharge. Left eye exhibits no discharge.  Neck: Normal range of motion. Neck supple. No JVD present. No tracheal deviation present. No thyromegaly present.  Cardiovascular: Normal rate, regular rhythm and normal heart sounds.   Pulmonary/Chest: No stridor. No  respiratory distress. She has no wheezes.  Abdominal: Soft. Bowel sounds are normal. She exhibits no distension and no mass. There is no tenderness. There is no rebound and no guarding.  Musculoskeletal: She exhibits no edema and no tenderness.  Lymphadenopathy:    She has no cervical adenopathy.  Neurological: She displays normal reflexes. No cranial nerve deficit. She exhibits normal muscle tone. Coordination normal.  Skin: No rash noted. No erythema.  No jaundiced  Psychiatric: She has a normal mood and affect. Her behavior is normal. Judgment and thought content normal.  Cane Good skin color today Hard hearing   Lab Results  Component Value Date   WBC 7.2 11/23/2013   HGB 12.1 11/23/2013   HCT 36.6 11/23/2013   PLT 271.0 11/23/2013   GLUCOSE 102* 11/23/2013   CHOL 203* 11/13/2011   TRIG 137.0 11/13/2011   HDL 82.50 11/13/2011   LDLDIRECT 101.7 11/13/2011   ALT 10 11/23/2013   AST 19 11/23/2013   NA 137 11/23/2013   K 3.9 11/23/2013   CL 102 11/23/2013   CREATININE 1.0 11/23/2013   BUN 24* 11/23/2013   CO2 26 11/23/2013   TSH 1.40 11/23/2013   INR 1.0 08/25/2013   ERCP - stricture/stent EGD      Assessment & Plan:

## 2014-03-07 NOTE — Progress Notes (Deleted)
Pre visit review using our clinic review tool, if applicable. No additional management support is needed unless otherwise documented below in the visit note. 

## 2014-03-09 ENCOUNTER — Telehealth: Payer: Self-pay | Admitting: *Deleted

## 2014-03-09 NOTE — Telephone Encounter (Signed)
Rf req for Alprazolam 1 mg 1 po tid # 90. Last filled 01/28/14. Ok to Rf?

## 2014-03-09 NOTE — Telephone Encounter (Signed)
OK to fill this prescription with additional refills x2 Thank you!  

## 2014-03-10 MED ORDER — ALPRAZOLAM 1 MG PO TABS: 1.0000 mg | ORAL_TABLET | Freq: Three times a day (TID) | ORAL | Status: DC | PRN

## 2014-03-10 NOTE — Telephone Encounter (Signed)
Done

## 2014-03-14 ENCOUNTER — Other Ambulatory Visit: Payer: Self-pay

## 2014-03-14 DIAGNOSIS — R630 Anorexia: Secondary | ICD-10-CM

## 2014-03-14 MED ORDER — MEGESTROL ACETATE 625 MG/5ML PO SUSP: 625.0000 mg | Freq: Every day | ORAL | Status: DC

## 2014-03-14 NOTE — Telephone Encounter (Signed)
Megace 150 ml PA approved, rx printed and faxed to pharmacy

## 2014-03-18 ENCOUNTER — Other Ambulatory Visit: Payer: Self-pay | Admitting: Internal Medicine

## 2014-03-23 ENCOUNTER — Other Ambulatory Visit: Payer: Medicare PPO

## 2014-04-08 ENCOUNTER — Other Ambulatory Visit (INDEPENDENT_AMBULATORY_CARE_PROVIDER_SITE_OTHER): Payer: Medicare PPO

## 2014-04-08 DIAGNOSIS — N390 Urinary tract infection, site not specified: Secondary | ICD-10-CM

## 2014-04-08 DIAGNOSIS — R109 Unspecified abdominal pain: Secondary | ICD-10-CM

## 2014-04-08 DIAGNOSIS — R17 Unspecified jaundice: Secondary | ICD-10-CM

## 2014-04-08 DIAGNOSIS — F341 Dysthymic disorder: Secondary | ICD-10-CM

## 2014-04-08 LAB — URINALYSIS
Bilirubin Urine: NEGATIVE
Hgb urine dipstick: NEGATIVE
Ketones, ur: NEGATIVE
LEUKOCYTES UA: NEGATIVE
Nitrite: NEGATIVE
SPECIFIC GRAVITY, URINE: 1.025 (ref 1.000–1.030)
URINE GLUCOSE: NEGATIVE
UROBILINOGEN UA: 0.2 (ref 0.0–1.0)
pH: 6 (ref 5.0–8.0)

## 2014-04-25 ENCOUNTER — Ambulatory Visit (INDEPENDENT_AMBULATORY_CARE_PROVIDER_SITE_OTHER)
Admission: RE | Admit: 2014-04-25 | Discharge: 2014-04-25 | Disposition: A | Discharge status: Home / Self Care | Payer: Medicare PPO | Source: Ambulatory Visit | Attending: Internal Medicine | Admitting: Internal Medicine

## 2014-04-25 ENCOUNTER — Encounter: Payer: Self-pay | Admitting: Internal Medicine

## 2014-04-25 ENCOUNTER — Ambulatory Visit (INDEPENDENT_AMBULATORY_CARE_PROVIDER_SITE_OTHER): Payer: Medicare PPO | Admitting: Internal Medicine

## 2014-04-25 VITALS — BP 130/80 | HR 80 | Temp 98.1°F | Resp 16 | Wt 84.0 lb

## 2014-04-25 DIAGNOSIS — R059 Cough, unspecified: Secondary | ICD-10-CM

## 2014-04-25 DIAGNOSIS — R05 Cough: Secondary | ICD-10-CM

## 2014-04-25 MED ORDER — HYDROCODONE-HOMATROPINE 5-1.5 MG/5ML PO SYRP: 5.0000 mL | ORAL_SOLUTION | Freq: Four times a day (QID) | ORAL | Status: DC | PRN

## 2014-04-25 MED ORDER — CEFTRIAXONE SODIUM 1 G IJ SOLR
1.0000 g | Freq: Once | INTRAMUSCULAR | Status: AC
  Administered 2014-04-25: 1 g via INTRAMUSCULAR

## 2014-04-25 MED ORDER — BUTALBITAL-APAP-CAFFEINE 50-325-40 MG PO TABS: ORAL_TABLET | ORAL | Status: DC

## 2014-04-25 MED ORDER — LEVOFLOXACIN 250 MG PO TABS
250.0000 mg | ORAL_TABLET | Freq: Every day | ORAL | Status: DC
Start: 2014-04-25 — End: 2014-05-03

## 2014-04-25 NOTE — Patient Instructions (Signed)
Go to ER if worse 

## 2014-04-25 NOTE — Assessment & Plan Note (Signed)
Possible CAP, R sided CXR Rocephin 500 mg IM Hycotussin syr prn Levaquin x10 d

## 2014-04-25 NOTE — Progress Notes (Signed)
Pre visit review using our clinic review tool, if applicable. No additional management support is needed unless otherwise documented below in the visit note. 

## 2014-04-25 NOTE — Progress Notes (Signed)
   Subjective:    Patient ID: Alexis Smith, female    DOB: Aug 17, 1916, 80 y.o.   MRN: [id number]  Cough This is a new problem. The current episode started in the past 7 days. The problem has been gradually improving. The cough is productive of purulent sputum and productive of sputum. Associated symptoms include chills, a fever and shortness of breath. Pertinent negatives include no sore throat or sweats. There is no history of asthma.      Review of Systems  Constitutional: Positive for fever and chills.  HENT: Negative for sore throat.   Respiratory: Positive for cough and shortness of breath.   Gastrointestinal: Negative for constipation.  Genitourinary: Negative for hematuria.  Musculoskeletal: Negative for gait problem.  Psychiatric/Behavioral: Positive for sleep disturbance. The patient is nervous/anxious.        Objective:   Physical Exam  Constitutional: She appears well-developed. No distress.  HENT:  Head: Normocephalic.  Right Ear: External ear normal.  Left Ear: External ear normal.  Nose: Nose normal.  Mouth/Throat: Oropharynx is clear and moist.  Eyes: Conjunctivae are normal. Pupils are equal, round, and reactive to light. Right eye exhibits no discharge. Left eye exhibits no discharge.  Neck: Normal range of motion. Neck supple. No JVD present. No tracheal deviation present. No thyromegaly present.  Cardiovascular: Normal rate, regular rhythm and normal heart sounds.   Pulmonary/Chest: No stridor. No respiratory distress. She has wheezes (R side). She has rales (R lung field).  Abdominal: Soft. Bowel sounds are normal. She exhibits no distension and no mass. There is no tenderness. There is no rebound and no guarding.  Musculoskeletal: She exhibits no edema and no tenderness.  Lymphadenopathy:    She has no cervical adenopathy.  Neurological: She displays normal reflexes. No cranial nerve deficit. She exhibits normal muscle tone. Coordination normal.  Skin: No  rash noted. No erythema.  Psychiatric: She has a normal mood and affect. Her behavior is normal. Judgment and thought content normal.          Assessment & Plan:

## 2014-05-03 ENCOUNTER — Encounter: Payer: Self-pay | Admitting: Internal Medicine

## 2014-05-03 ENCOUNTER — Ambulatory Visit (INDEPENDENT_AMBULATORY_CARE_PROVIDER_SITE_OTHER): Payer: Medicare PPO | Admitting: Internal Medicine

## 2014-05-03 VITALS — BP 150/76 | HR 72 | Temp 97.1°F | Resp 16 | Wt 86.0 lb

## 2014-05-03 DIAGNOSIS — R059 Cough, unspecified: Secondary | ICD-10-CM

## 2014-05-03 DIAGNOSIS — F341 Dysthymic disorder: Secondary | ICD-10-CM

## 2014-05-03 DIAGNOSIS — R05 Cough: Secondary | ICD-10-CM

## 2014-05-03 MED ORDER — FLUTICASONE FUROATE-VILANTEROL 100-25 MCG/INH IN AEPB: 1.0000 | INHALATION_SPRAY | Freq: Every day | RESPIRATORY_TRACT | Status: DC

## 2014-05-03 MED ORDER — HYDROCODONE-HOMATROPINE 5-1.5 MG/5ML PO SYRP: 5.0000 mL | ORAL_SOLUTION | Freq: Four times a day (QID) | ORAL | Status: DC | PRN

## 2014-05-03 NOTE — Assessment & Plan Note (Signed)
Continue with current prescription therapy as reflected on the Med list.  

## 2014-05-03 NOTE — Assessment & Plan Note (Signed)
Breo

## 2014-05-03 NOTE — Progress Notes (Signed)
Pre visit review using our clinic review tool, if applicable. No additional management support is needed unless otherwise documented below in the visit note. 

## 2014-05-03 NOTE — Progress Notes (Signed)
   Subjective:    Cough This is a recurrent problem. The current episode started 1 to 4 weeks ago. The problem has been unchanged. The cough is productive of purulent sputum and productive of sputum. Associated symptoms include shortness of breath. Pertinent negatives include no chills, fever, sore throat, sweats or wheezing. The treatment provided no relief. Her past medical history is significant for bronchitis. There is no history of asthma.      Review of Systems  Constitutional: Negative for fever and chills.  HENT: Negative for sore throat.   Respiratory: Positive for cough and shortness of breath. Negative for wheezing.   Gastrointestinal: Negative for constipation.  Genitourinary: Negative for hematuria.  Musculoskeletal: Negative for gait problem.  Psychiatric/Behavioral: Positive for sleep disturbance. The patient is nervous/anxious.        Objective:   Physical Exam  Constitutional: She appears well-developed. No distress.  HENT:  Head: Normocephalic.  Right Ear: External ear normal.  Left Ear: External ear normal.  Nose: Nose normal.  Mouth/Throat: Oropharynx is clear and moist.  Eyes: Conjunctivae are normal. Pupils are equal, round, and reactive to light. Right eye exhibits no discharge. Left eye exhibits no discharge.  Neck: Normal range of motion. Neck supple. No JVD present. No tracheal deviation present. No thyromegaly present.  Cardiovascular: Normal rate, regular rhythm and normal heart sounds.   Pulmonary/Chest: No stridor. No respiratory distress. She has no wheezes. She has no rales. She exhibits no tenderness.  Abdominal: Soft. Bowel sounds are normal. She exhibits no distension and no mass. There is no tenderness. There is no rebound and no guarding.  Musculoskeletal: She exhibits no edema and no tenderness.  Lymphadenopathy:    She has no cervical adenopathy.  Neurological: She displays normal reflexes. No cranial nerve deficit. She exhibits normal  muscle tone. Coordination normal.  Skin: No rash noted. No erythema.  Psychiatric: She has a normal mood and affect. Her behavior is normal. Judgment and thought content normal.   I personally provided Breo inhaler use teaching. After the teaching patient was able to demonstrate it's use effectively. All questions were answered  CXR ok    Assessment & Plan:

## 2014-05-24 ENCOUNTER — Ambulatory Visit: Payer: Medicare PPO | Admitting: Internal Medicine

## 2014-06-07 ENCOUNTER — Ambulatory Visit: Payer: Medicare PPO | Admitting: Internal Medicine

## 2014-06-09 ENCOUNTER — Telehealth: Payer: Self-pay | Admitting: Internal Medicine

## 2014-06-09 NOTE — Telephone Encounter (Signed)
Patient's spouse is calling to request a refill on the patient's ALPRAZolam Alexis Smith) 1 MG prescription. He says that [facility] sent two requests. Please advise.

## 2014-06-10 MED ORDER — ALPRAZOLAM 1 MG PO TABS: 1.0000 mg | ORAL_TABLET | Freq: Three times a day (TID) | ORAL | Status: AC | PRN

## 2014-06-10 NOTE — Telephone Encounter (Signed)
Pilar Plate called to see when RX will be ready.  Will this be called in or will they have to pick it up?

## 2014-06-10 NOTE — Telephone Encounter (Signed)
Done. Pt informed.

## 2014-06-10 NOTE — Telephone Encounter (Signed)
OK to fill this prescription with additional refills x3 Thank you!  

## 2014-07-04 ENCOUNTER — Other Ambulatory Visit: Payer: Self-pay | Admitting: Internal Medicine

## 2014-07-06 ENCOUNTER — Other Ambulatory Visit: Payer: Self-pay | Admitting: Internal Medicine

## 2014-07-07 NOTE — Telephone Encounter (Signed)
Pt call to check up on this request. Please help

## 2014-07-08 ENCOUNTER — Telehealth: Payer: Self-pay | Admitting: Internal Medicine

## 2014-07-08 NOTE — Telephone Encounter (Signed)
Called pharmacy to check if they received rx. Pharmacy stated that rx was not sent in. Gave verbal rx over the phone.

## 2014-07-08 NOTE — Telephone Encounter (Signed)
Calling on behalf of [facility].  [place] has faxed three times for butalbital.  Patient is in need of meds.  Please advise.

## 2014-07-14 ENCOUNTER — Other Ambulatory Visit: Payer: Self-pay | Admitting: Internal Medicine

## 2014-07-19 ENCOUNTER — Other Ambulatory Visit: Payer: Self-pay | Admitting: Internal Medicine

## 2014-07-21 ENCOUNTER — Other Ambulatory Visit: Payer: Self-pay | Admitting: Internal Medicine

## 2014-07-28 ENCOUNTER — Other Ambulatory Visit: Payer: Self-pay | Admitting: Internal Medicine

## 2014-07-29 ENCOUNTER — Encounter: Payer: Self-pay | Admitting: Internal Medicine

## 2014-07-29 ENCOUNTER — Ambulatory Visit (INDEPENDENT_AMBULATORY_CARE_PROVIDER_SITE_OTHER): Payer: Medicare PPO | Admitting: Internal Medicine

## 2014-07-29 VITALS — BP 100/60 | HR 76 | Temp 97.9°F | Resp 16 | Wt 83.0 lb

## 2014-07-29 DIAGNOSIS — G609 Hereditary and idiopathic neuropathy, unspecified: Secondary | ICD-10-CM

## 2014-07-29 DIAGNOSIS — R109 Unspecified abdominal pain: Secondary | ICD-10-CM

## 2014-07-29 DIAGNOSIS — R17 Unspecified jaundice: Secondary | ICD-10-CM

## 2014-07-29 DIAGNOSIS — D649 Anemia, unspecified: Secondary | ICD-10-CM

## 2014-07-29 DIAGNOSIS — R634 Abnormal weight loss: Secondary | ICD-10-CM

## 2014-07-29 MED ORDER — SERTRALINE HCL 100 MG PO TABS: ORAL_TABLET | ORAL | Status: DC

## 2014-07-29 NOTE — Progress Notes (Signed)
   Subjective:    HPI  F/u jaundice - resolved and wt loss - better ; c/o fatigue - no change  She did have ERCP/STENT; now she is s/p EGD w/esoph dilatation  Hydroxyzine 40 mg helped her to sleep for a few hrs  F/u restless legs, legs feel cold, insomnia - better on Gabapentin   BP Readings from Last 3 Encounters:  07/29/14 100/60  05/03/14 150/76  04/25/14 130/80   Wt Readings from Last 3 Encounters:  07/29/14 83 lb (37.649 kg)  05/03/14 86 lb (39.009 kg)  04/25/14 84 lb (38.102 kg)      Review of Systems  HENT: Negative for rhinorrhea and sneezing.   Gastrointestinal: Negative for constipation and rectal pain.  Genitourinary: Negative for hematuria.  Musculoskeletal: Negative for gait problem and neck pain.  Psychiatric/Behavioral: Positive for sleep disturbance. The patient is nervous/anxious.        Objective:   Physical Exam  Constitutional: She appears well-developed. No distress.  HENT:  Head: Normocephalic.  Right Ear: External ear normal.  Left Ear: External ear normal.  Nose: Nose normal.  Mouth/Throat: Oropharynx is clear and moist.  Eyes: Conjunctivae are normal. Pupils are equal, round, and reactive to light. Right eye exhibits no discharge. Left eye exhibits no discharge.  Neck: Normal range of motion. Neck supple. No JVD present. No tracheal deviation present. No thyromegaly present.  Cardiovascular: Normal rate, regular rhythm and normal heart sounds.   Pulmonary/Chest: No stridor. No respiratory distress. She has no wheezes. She has no rales. She exhibits no tenderness.  Abdominal: Soft. Bowel sounds are normal. She exhibits no distension and no mass. There is no tenderness. There is no rebound and no guarding.  Musculoskeletal: She exhibits no edema and no tenderness.  Lymphadenopathy:    She has no cervical adenopathy.  Neurological: She displays normal reflexes. No cranial nerve deficit. She exhibits normal muscle tone. Coordination normal.   Skin: No rash noted. No erythema.  Psychiatric: She has a normal mood and affect. Her behavior is normal. Judgment and thought content normal.   Lab Results  Component Value Date   WBC 6.2 03/07/2014   HGB 11.9* 03/07/2014   HCT 36.0 03/07/2014   PLT 247.0 03/07/2014   GLUCOSE 85 03/07/2014   CHOL 203* 11/13/2011   TRIG 137.0 11/13/2011   HDL 82.50 11/13/2011   LDLDIRECT 101.7 11/13/2011   ALT 9 03/07/2014   AST 22 03/07/2014   NA 138 03/07/2014   K 4.1 03/07/2014   CL 104 03/07/2014   CREATININE 0.9 03/07/2014   BUN 17 03/07/2014   CO2 24 03/07/2014   TSH 1.40 11/23/2013   INR 1.0 08/25/2013     CXR ok    Assessment & Plan:

## 2014-07-29 NOTE — Assessment & Plan Note (Signed)
Labs

## 2014-07-29 NOTE — Assessment & Plan Note (Signed)
Continue with current prescription therapy as reflected on the Med list.  

## 2014-07-29 NOTE — Assessment & Plan Note (Signed)
Labs  Continue with current prescription therapy as reflected on the Med list.  

## 2014-07-29 NOTE — Assessment & Plan Note (Signed)
Doing well Labs  

## 2014-07-29 NOTE — Assessment & Plan Note (Signed)
No relapse 

## 2014-07-29 NOTE — Progress Notes (Signed)
Pre visit review using our clinic review tool, if applicable. No additional management support is needed unless otherwise documented below in the visit note. 

## 2014-08-08 ENCOUNTER — Other Ambulatory Visit (INDEPENDENT_AMBULATORY_CARE_PROVIDER_SITE_OTHER): Payer: Medicare PPO

## 2014-08-08 DIAGNOSIS — R17 Unspecified jaundice: Secondary | ICD-10-CM

## 2014-08-08 DIAGNOSIS — R634 Abnormal weight loss: Secondary | ICD-10-CM

## 2014-08-08 DIAGNOSIS — R109 Unspecified abdominal pain: Secondary | ICD-10-CM

## 2014-08-08 LAB — URINALYSIS, ROUTINE W REFLEX MICROSCOPIC
BILIRUBIN URINE: NEGATIVE
Hgb urine dipstick: NEGATIVE
Ketones, ur: NEGATIVE
Nitrite: NEGATIVE
PH: 6 (ref 5.0–8.0)
Specific Gravity, Urine: 1.015 (ref 1.000–1.030)
TOTAL PROTEIN, URINE-UPE24: NEGATIVE
URINE GLUCOSE: NEGATIVE
Urobilinogen, UA: 0.2 (ref 0.0–1.0)

## 2014-09-09 ENCOUNTER — Telehealth: Payer: Self-pay | Admitting: Internal Medicine

## 2014-09-09 NOTE — Telephone Encounter (Signed)
Use Antivert (OTC) 12.5 mg bid w/caution OV if problems Thx

## 2014-09-09 NOTE — Telephone Encounter (Signed)
Pilar Plate states that Alexis Smith is very swimmy headed and would like something sent in to [facility].

## 2014-09-12 MED ORDER — MECLIZINE HCL 12.5 MG PO TABS: 12.5000 mg | ORAL_TABLET | Freq: Two times a day (BID) | ORAL | Status: DC | PRN

## 2014-09-12 NOTE — Telephone Encounter (Signed)
Notified pt with md response.../lmb 

## 2014-09-13 ENCOUNTER — Ambulatory Visit (INDEPENDENT_AMBULATORY_CARE_PROVIDER_SITE_OTHER): Payer: Medicare PPO | Admitting: *Deleted

## 2014-09-13 DIAGNOSIS — Z23 Encounter for immunization: Secondary | ICD-10-CM

## 2014-09-26 ENCOUNTER — Ambulatory Visit: Payer: Medicare PPO | Admitting: Internal Medicine

## 2014-09-29 ENCOUNTER — Other Ambulatory Visit: Payer: Self-pay | Admitting: Internal Medicine

## 2014-10-04 ENCOUNTER — Encounter (HOSPITAL_COMMUNITY): Payer: Self-pay | Admitting: Emergency Medicine

## 2014-10-04 ENCOUNTER — Emergency Department (HOSPITAL_COMMUNITY): Payer: Medicare PPO

## 2014-10-04 ENCOUNTER — Inpatient Hospital Stay (HOSPITAL_COMMUNITY)
Admission: EM | Admit: 2014-10-04 | Discharge: 2014-10-16 | DRG: 064 | Disposition: E | Payer: Medicare PPO | Attending: Internal Medicine | Admitting: Internal Medicine

## 2014-10-04 DIAGNOSIS — E876 Hypokalemia: Secondary | ICD-10-CM | POA: Diagnosis present

## 2014-10-04 DIAGNOSIS — J9601 Acute respiratory failure with hypoxia: Secondary | ICD-10-CM

## 2014-10-04 DIAGNOSIS — Z79899 Other long term (current) drug therapy: Secondary | ICD-10-CM

## 2014-10-04 DIAGNOSIS — H5347 Heteronymous bilateral field defects: Secondary | ICD-10-CM | POA: Diagnosis present

## 2014-10-04 DIAGNOSIS — G629 Polyneuropathy, unspecified: Secondary | ICD-10-CM | POA: Diagnosis present

## 2014-10-04 DIAGNOSIS — J690 Pneumonitis due to inhalation of food and vomit: Secondary | ICD-10-CM | POA: Diagnosis not present

## 2014-10-04 DIAGNOSIS — E46 Unspecified protein-calorie malnutrition: Secondary | ICD-10-CM | POA: Diagnosis present

## 2014-10-04 DIAGNOSIS — F329 Major depressive disorder, single episode, unspecified: Secondary | ICD-10-CM | POA: Diagnosis present

## 2014-10-04 DIAGNOSIS — Z66 Do not resuscitate: Secondary | ICD-10-CM | POA: Diagnosis not present

## 2014-10-04 DIAGNOSIS — R471 Dysarthria and anarthria: Secondary | ICD-10-CM | POA: Diagnosis present

## 2014-10-04 DIAGNOSIS — R64 Cachexia: Secondary | ICD-10-CM | POA: Diagnosis present

## 2014-10-04 DIAGNOSIS — I6350 Cerebral infarction due to unspecified occlusion or stenosis of unspecified cerebral artery: Secondary | ICD-10-CM

## 2014-10-04 DIAGNOSIS — G2581 Restless legs syndrome: Secondary | ICD-10-CM | POA: Diagnosis present

## 2014-10-04 DIAGNOSIS — Z515 Encounter for palliative care: Secondary | ICD-10-CM | POA: Diagnosis not present

## 2014-10-04 DIAGNOSIS — G8194 Hemiplegia, unspecified affecting left nondominant side: Secondary | ICD-10-CM | POA: Diagnosis present

## 2014-10-04 DIAGNOSIS — Z8541 Personal history of malignant neoplasm of cervix uteri: Secondary | ICD-10-CM | POA: Diagnosis not present

## 2014-10-04 DIAGNOSIS — F419 Anxiety disorder, unspecified: Secondary | ICD-10-CM | POA: Diagnosis present

## 2014-10-04 DIAGNOSIS — I10 Essential (primary) hypertension: Secondary | ICD-10-CM | POA: Diagnosis present

## 2014-10-04 DIAGNOSIS — K219 Gastro-esophageal reflux disease without esophagitis: Secondary | ICD-10-CM | POA: Diagnosis present

## 2014-10-04 DIAGNOSIS — R0600 Dyspnea, unspecified: Secondary | ICD-10-CM

## 2014-10-04 DIAGNOSIS — E039 Hypothyroidism, unspecified: Secondary | ICD-10-CM | POA: Diagnosis present

## 2014-10-04 DIAGNOSIS — T17920A Food in respiratory tract, part unspecified causing asphyxiation, initial encounter: Secondary | ICD-10-CM

## 2014-10-04 DIAGNOSIS — I6340 Cerebral infarction due to embolism of unspecified cerebral artery: Principal | ICD-10-CM | POA: Diagnosis present

## 2014-10-04 DIAGNOSIS — R29810 Facial weakness: Secondary | ICD-10-CM | POA: Diagnosis present

## 2014-10-04 DIAGNOSIS — R1310 Dysphagia, unspecified: Secondary | ICD-10-CM | POA: Diagnosis present

## 2014-10-04 DIAGNOSIS — I6330 Cerebral infarction due to thrombosis of unspecified cerebral artery: Secondary | ICD-10-CM | POA: Diagnosis present

## 2014-10-04 DIAGNOSIS — Z681 Body mass index (BMI) 19 or less, adult: Secondary | ICD-10-CM | POA: Diagnosis not present

## 2014-10-04 DIAGNOSIS — E785 Hyperlipidemia, unspecified: Secondary | ICD-10-CM | POA: Diagnosis present

## 2014-10-04 DIAGNOSIS — T17928A Food in respiratory tract, part unspecified causing other injury, initial encounter: Secondary | ICD-10-CM

## 2014-10-04 DIAGNOSIS — Z823 Family history of stroke: Secondary | ICD-10-CM

## 2014-10-04 DIAGNOSIS — I639 Cerebral infarction, unspecified: Secondary | ICD-10-CM | POA: Diagnosis present

## 2014-10-04 LAB — DIFFERENTIAL
Basophils Absolute: 0 10*3/uL (ref 0.0–0.1)
Basophils Relative: 0 % (ref 0–1)
Eosinophils Absolute: 0.1 10*3/uL (ref 0.0–0.7)
Eosinophils Relative: 1 % (ref 0–5)
LYMPHS ABS: 0.8 10*3/uL (ref 0.7–4.0)
Lymphocytes Relative: 9 % — ABNORMAL LOW (ref 12–46)
MONOS PCT: 6 % (ref 3–12)
Monocytes Absolute: 0.6 10*3/uL (ref 0.1–1.0)
NEUTROS ABS: 7.4 10*3/uL (ref 1.7–7.7)
NEUTROS PCT: 84 % — AB (ref 43–77)

## 2014-10-04 LAB — I-STAT CHEM 8, ED
BUN: 21 mg/dL (ref 6–23)
CALCIUM ION: 1.16 mmol/L (ref 1.13–1.30)
CREATININE: 1 mg/dL (ref 0.50–1.10)
Chloride: 101 mEq/L (ref 96–112)
Glucose, Bld: 95 mg/dL (ref 70–99)
HEMATOCRIT: 38 % (ref 36.0–46.0)
Hemoglobin: 12.9 g/dL (ref 12.0–15.0)
POTASSIUM: 3.5 meq/L — AB (ref 3.7–5.3)
Sodium: 138 mEq/L (ref 137–147)
TCO2: 27 mmol/L (ref 0–100)

## 2014-10-04 LAB — COMPREHENSIVE METABOLIC PANEL
ALBUMIN: 3.5 g/dL (ref 3.5–5.2)
ALK PHOS: 136 U/L — AB (ref 39–117)
ALT: 6 U/L (ref 0–35)
AST: 15 U/L (ref 0–37)
Anion gap: 13 (ref 5–15)
BUN: 21 mg/dL (ref 6–23)
CHLORIDE: 100 meq/L (ref 96–112)
CO2: 27 mEq/L (ref 19–32)
Calcium: 9.1 mg/dL (ref 8.4–10.5)
Creatinine, Ser: 0.91 mg/dL (ref 0.50–1.10)
GFR calc non Af Amer: 52 mL/min — ABNORMAL LOW (ref >90)
GFR, EST AFRICAN AMERICAN: 60 mL/min — AB (ref >90)
GLUCOSE: 95 mg/dL (ref 70–99)
POTASSIUM: 3.7 meq/L (ref 3.7–5.3)
Sodium: 140 mEq/L (ref 137–147)
Total Protein: 7 g/dL (ref 6.0–8.3)

## 2014-10-04 LAB — PROTIME-INR
INR: 1.05 (ref 0.00–1.49)
PROTHROMBIN TIME: 13.8 s (ref 11.6–15.2)

## 2014-10-04 LAB — I-STAT TROPONIN, ED: Troponin i, poc: 0.02 ng/mL (ref 0.00–0.08)

## 2014-10-04 LAB — CBC
HCT: 35.3 % — ABNORMAL LOW (ref 36.0–46.0)
HEMOGLOBIN: 11.5 g/dL — AB (ref 12.0–15.0)
MCH: 29 pg (ref 26.0–34.0)
MCHC: 32.6 g/dL (ref 30.0–36.0)
MCV: 89.1 fL (ref 78.0–100.0)
PLATELETS: 262 10*3/uL (ref 150–400)
RBC: 3.96 MIL/uL (ref 3.87–5.11)
RDW: 13.7 % (ref 11.5–15.5)
WBC: 8.8 10*3/uL (ref 4.0–10.5)

## 2014-10-04 LAB — ETHANOL: Alcohol, Ethyl (B): <11 mg/dL (ref 0–11)

## 2014-10-04 LAB — APTT: APTT: 28 s (ref 24–37)

## 2014-10-04 LAB — CBG MONITORING, ED: Glucose-Capillary: 107 mg/dL — ABNORMAL HIGH (ref 70–99)

## 2014-10-04 MED ORDER — ENOXAPARIN SODIUM 40 MG/0.4ML ~~LOC~~ SOLN: 40.0000 mg | SUBCUTANEOUS | Status: DC

## 2014-10-04 MED ORDER — ENOXAPARIN SODIUM 30 MG/0.3ML ~~LOC~~ SOLN: 20.0000 mg | SUBCUTANEOUS | Status: DC

## 2014-10-04 MED ORDER — ENOXAPARIN SODIUM 30 MG/0.3ML ~~LOC~~ SOLN
20.0000 mg | SUBCUTANEOUS | Status: DC
  Administered 2014-10-04 – 2014-10-06 (×3): 20 mg via SUBCUTANEOUS
  Filled 2014-10-04 (×2): qty 0.3
  Filled 2014-10-04: qty 0.2
  Filled 2014-10-04: qty 0.3
  Filled 2014-10-04 (×3): qty 0.2

## 2014-10-04 MED ORDER — LEVOTHYROXINE SODIUM 100 MCG IV SOLR
25.0000 ug | Freq: Every day | INTRAVENOUS | Status: DC
  Administered 2014-10-05 – 2014-10-06 (×2): 25 ug via INTRAVENOUS
  Filled 2014-10-04 (×3): qty 5

## 2014-10-04 MED ORDER — SODIUM CHLORIDE 0.9 % IV SOLN
INTRAVENOUS | Status: DC
  Administered 2014-10-04: 18:00:00 via INTRAVENOUS
  Administered 2014-10-05: 10 mL/h via INTRAVENOUS

## 2014-10-04 MED ORDER — STROKE: EARLY STAGES OF RECOVERY BOOK
Freq: Once | Status: DC
  Filled 2014-10-04: qty 1

## 2014-10-04 MED ORDER — ASPIRIN 300 MG RE SUPP
150.0000 mg | Freq: Every day | RECTAL | Status: DC
  Administered 2014-10-04: 150 mg via RECTAL

## 2014-10-04 NOTE — ED Notes (Signed)
Pt placed into gown and on monitor upon arrival to room. Pt monitored by blood pressure, pulse ox, and 12 lead. Pts CBG= 107 reported to [employer]. Pts family remains at bedside.

## 2014-10-04 NOTE — ED Notes (Signed)
Pt remains monitored by blood pressure, pulse ox, and 12 lead. Pts family remains at bedside.  

## 2014-10-04 NOTE — Code Documentation (Signed)
80yo female arriving to [facility] via [place] at 1140.  EMS reports that the patient was on the couch and fell onto the floor when trying to get up to answer the phone. EMS assessed left hemiparesis, left facial droop and aphasia and activated Code Stroke.  Patient taken to CT on arrival.  Stroke team at bedside.  NIHSS 5, see documentation for details and code stroke times.  Patient with right gaze preference, left hemianopia, left facial droop, and dysarthria.  LKW unclear.  Family arrived and reports that the patient had stroke symptoms yesterday evening, but family did not seek treatment.  Family reports left sided weakness, facial droop and slurred speech yesterday.  Patient's son reports that her facial droop had not resolved last night when he left.  Patient slept on the couch last night with her husband in the chair beside her.  He assisted her to the bathroom this morning then back to the couch while he ran an errand.  While out the patient fell from the couch and was found by a family friend.  Of note, the patient's daughter passed away on 03/30/23 and the funeral is tomorrow.  Patient is outside the window for treatment with tPA.  No acute stroke treatment at this time per Dr. Armida Sans.  Bedside handoff with ED RN Jerene Pitch.

## 2014-10-04 NOTE — Progress Notes (Signed)
Patient in room resting on L side when nurse arrived for assessment. Patient frail, but able to participate in admission. No family present at this time. Patient educated to safety needs and encouraged to utilize call system for any needs. Reviewed patient orders and POC with patient- who is now intermittently dozing.

## 2014-10-04 NOTE — ED Notes (Signed)
Neuro at bedside.

## 2014-10-04 NOTE — ED Notes (Signed)
[facility]- pt brought in with left side weakness and facial droop. Pt woke up and began having trouble walking. Pt alert and oriented on arrival, some left side facial droop still noted. Slightly hypertensive in field 169/85, 84bpm, 98% RA, CBG: 160.

## 2014-10-04 NOTE — Consult Note (Signed)
Referring Physician: Kathrynn Humble    Chief Complaint: Code stroke  HPI:                                                                                                                                         Alexis Smith is an 80 y.o. female brought to ED after family member noted she had a left facial droop and left sided weakness. Patient is unable to give good history but family states they noticed a left facial droop and decreased vision last night which did not resolve. Apparently she was left alone this AM and when husband returned found her on the floor with left sided weakness.  The left side weakness has resolved.  Currently patient shows a left facial droop, left hemianopia, dysarthria and right gaze preference.  Patient was not a tPA candidate due to unknown LSN.   Date last known well: Date: 10/03/2014 Time last known well: Unable to determine tPA Given: No: unknown LSN   Past Medical History  Diagnosis Date  . Blood in stool   . FHx: allergies   . Thyroid condition   . Diverticulitis   . Anemia   . Anxiety   . Depression   . History of orthostatic hypotension   . Restless leg syndrome   . Insomnia   . Osteopenia   . GERD (gastroesophageal reflux disease)   . Hemorrhoidal skin tag   . Hyperlipidemia   . Hypothyroidism   . Diverticulosis   . Hemorrhoids   . Chronic urethritis   . Weight loss   . Peripheral neuropathy   . Cervical cancer 1960  . Arthritis     hands   . Dysphagia   . Hiatal hernia     Past Surgical History  Procedure Laterality Date  . Abdominal hysterectomy  1960  . Hand surgery      bilateral  . Hemorrhoid surgery    . Cataract extraction      Bilaterally  . Ercp  04/16/2012    Procedure: ENDOSCOPIC RETROGRADE CHOLANGIOPANCREATOGRAPHY (ERCP);  Surgeon: Inda Castle, MD;  Location: [facility];  Service: Gastroenterology;  Laterality: N/A;  . Ercp    . Colonoscopy    . Breast lumpectomy      left  . Egd with dilation  06/2012    Dr. Deatra Ina  .  Esophagogastroduodenoscopy  07/28/2012    Procedure: ESOPHAGOGASTRODUODENOSCOPY (EGD);  Surgeon: Jerene Bears, MD;  Location: [facility];  Service: Gastroenterology;  Laterality: N/A;  balloon dilation  . Balloon dilation  07/28/2012    Procedure: BALLOON DILATION;  Surgeon: Jerene Bears, MD;  Location: [facility];  Service: Gastroenterology;  Laterality: N/A;    Family History  Problem Relation Age of Onset  . Colon cancer Brother   . Stroke Father   . Dementia Sister     x 2  . Diabetes Cousin   .  COPD Daughter    Social History:  reports that she has never smoked. She has never used smokeless tobacco. She reports that she drinks alcohol. She reports that she does not use illicit drugs.  Allergies:  Allergies  Allergen Reactions  . Codeine Phosphate     REACTION: unspecified  . Tetanus Toxoid Adsorbed     REACTION: unspecified    Medications:                                                                                                                           No current facility-administered medications for this encounter.   No current outpatient prescriptions on file.     ROS:                                                                                                                                       History obtained from unobtainable from patient due to mental status   Neurologic Examination:                                                                                                      Blood pressure 146/76, pulse 83, temperature 97.8 F (36.6 C), temperature source Oral, resp. rate 23, SpO2 98.00%.   General: NAD Mental Status: Alert, oriented, thought content appropriate.  Speech dysarthric without evidence of aphasia.  Able to follow 3 step commands without difficulty. Cranial Nerves: II: Discs flat bilaterally; left hemianopia, pupils equal, round, reactive to light and accommodation III,IV, VI: ptosis not present, extra-ocular motions intact  bilaterally V,VII: smile asymmetric on the left, facial light touch sensation normal bilaterally VIII: hearing normal bilaterally IX,X: gag reflex present XI: bilateral shoulder shrug XII: midline tongue extension without atrophy or fasciculations  Motor: Right : Upper extremity   5/5    Left:     Upper extremity   5/5  Lower extremity   5/5     Lower extremity   5/5 Tone and  bulk:normal tone throughout; no atrophy noted Sensory: Pinprick and light touch intact throughout, bilaterally Deep Tendon Reflexes:  Right: Upper Extremity   Left: Upper extremity   biceps (C-5 to C-6) 2/4   biceps (C-5 to C-6) 2/4 tricep (C7) 2/4    triceps (C7) 2/4 Brachioradialis (C6) 2/4  Brachioradialis (C6) 2/4  Lower Extremity Lower Extremity  quadriceps (L-2 to L-4) 2/4   quadriceps (L-2 to L-4) 2/4 Achilles (S1) 0/4   Achilles (S1) 0/4  Plantars: Right: downgoing   Left: downgoing Cerebellar: normal finger-to-nose,  normal heel-to-shin test Gait: not tested. CV: pulses palpable throughout    Lab Results: Basic Metabolic Panel:  Recent Labs Lab 10/05/2014 1148  NA 138  K 3.5*  CL 101  GLUCOSE 95  BUN 21  CREATININE 1.00    Liver Function Tests: No results found for this basename: AST, ALT, ALKPHOS, BILITOT, PROT, ALBUMIN,  in the last 168 hours No results found for this basename: LIPASE, AMYLASE,  in the last 168 hours No results found for this basename: AMMONIA,  in the last 168 hours  CBC:  Recent Labs Lab 09/24/2014 1144 10/05/2014 1148  WBC 8.8  --   NEUTROABS 7.4  --   HGB 11.5* 12.9  HCT 35.3* 38.0  MCV 89.1  --   PLT 262  --     Cardiac Enzymes: No results found for this basename: CKTOTAL, CKMB, CKMBINDEX, TROPONINI,  in the last 168 hours  Lipid Panel: No results found for this basename: CHOL, TRIG, HDL, CHOLHDL, VLDL, LDLCALC,  in the last 168 hours  CBG: No results found for this basename: GLUCAP,  in the last 168 hours  Microbiology: Results for orders  placed in visit on 05/26/12  URINE CULTURE     Status: None   Collection Time    05/26/12  9:03 AM      Result Value Ref Range Status   Culture ENTEROCOCCUS SPECIES   Final   Colony Count >=100,000 COLONIES/ML   Final   Organism ID, Bacteria ENTEROCOCCUS SPECIES   Final    Coagulation Studies:  Recent Labs  10/15/2014 1144  LABPROT 13.8  INR 1.05    Imaging: No results found.     Assessment and plan discussed with with attending physician and they are in agreement.    Etta Quill PA-C [facility] [phone]  10/15/2014, 12:11 PM   Assessment: 80 y.o. female with onset of left facial droop and possible left hemianopia last night.  Brought to hospital after being found down in house with increase left facial droop.  Exam shows left hemianopia, left facial droop and dysarthria.  CT head shows no acute stroke or bleed. Patient likely has suffered a right brain cortical stroke but is out of window for tPA.   Stroke Risk Factors - hyperlipidemia  1. HgbA1c, fasting lipid panel 2. MRI, MRA  of the brain without contrast 3. PT consult, OT consult, Speech consult 4. Echocardiogram 5. Carotid dopplers 6. Prophylactic therapy-ASA if ok with primary team as she has a history of bloody stools  7. Risk factor modification 8. Telemetry monitoring 9. Frequent neuro checks 10 NPO until bed side SSS  Patient seen and examined together with physician assistant and I concur with the assessment and plan.  Dorian Pod, MD

## 2014-10-04 NOTE — H&P (Signed)
- [facility] History and Physical  [name]:[id number] DOB: June 24, 1916 DOA: 10/12/2014  Referring physician: EDP PCP: Walker Kehr, MD   Chief Complaint: was found to worsening of the lef sided weakness.   HPI: Alexis Smith is a 80 y.o. female  With prior h/o hypothyroidism, was brought in for wrsening left sided weakness and left facial droop occurred yesterday. Today she was found on the floor and was brought to the hospital . She is referred to medical service for evaluation of CVA. Neurology consulted.    Review of Systems:  ROS could not be obtained.   Past Medical History  Diagnosis Date  . Blood in stool   . FHx: allergies   . Thyroid condition   . Diverticulitis   . Anemia   . Anxiety   . Depression   . History of orthostatic hypotension   . Restless leg syndrome   . Insomnia   . Osteopenia   . GERD (gastroesophageal reflux disease)   . Hemorrhoidal skin tag   . Hyperlipidemia   . Hypothyroidism   . Diverticulosis   . Hemorrhoids   . Chronic urethritis   . Weight loss   . Peripheral neuropathy   . Cervical cancer 1960  . Arthritis     hands   . Dysphagia   . Hiatal hernia    Past Surgical History  Procedure Laterality Date  . Abdominal hysterectomy  1960  . Hand surgery      bilateral  . Hemorrhoid surgery    . Cataract extraction      Bilaterally  . Ercp  04/16/2012    Procedure: ENDOSCOPIC RETROGRADE CHOLANGIOPANCREATOGRAPHY (ERCP);  Surgeon: Inda Castle, MD;  Location: [facility];  Service: Gastroenterology;  Laterality: N/A;  . Ercp    . Colonoscopy    . Breast lumpectomy      left  . Egd with dilation  06/2012    Dr. Deatra Ina  . Esophagogastroduodenoscopy  07/28/2012    Procedure: ESOPHAGOGASTRODUODENOSCOPY (EGD);  Surgeon: Jerene Bears, MD;  Location: [facility];  Service: Gastroenterology;  Laterality: N/A;  balloon dilation  . Balloon dilation  07/28/2012    Procedure: BALLOON DILATION;  Surgeon: Jerene Bears, MD;  Location: [facility];  Service: Gastroenterology;  Laterality: N/A;   Social History:  reports that she has never smoked. She has never used smokeless tobacco. She reports that she drinks alcohol. She reports that she does not use illicit drugs.  Allergies  Allergen Reactions  . Codeine Phosphate     REACTION: unspecified  . Tetanus Toxoid Adsorbed     REACTION: unspecified    Family History  Problem Relation Age of Onset  . Colon cancer Brother   . Stroke Father   . Dementia Sister     x 2  . Diabetes Cousin   . COPD Daughter      Prior to Admission medications   Medication Sig Start Date End Date Taking? Authorizing Provider  ALPRAZolam Duanne Moron) 1 MG tablet Take 1 tablet (1 mg total) by mouth 3 (three) times daily as needed for anxiety. 06/10/14  Yes Aleksei Plotnikov V, MD  butalbital-acetaminophen-caffeine (FIORICET, ESGIC) 50-325-40 MG per tablet Take 1 tablet by mouth 3 (three) times daily as needed for headache.   Yes Historical Provider, MD  gabapentin (NEURONTIN) 100 MG capsule Take 100-200 mg by mouth at bedtime as needed (pain). 11/23/13  Yes Aleksei Plotnikov V, MD  levothyroxine (SYNTHROID, LEVOTHROID) 50 MCG tablet Take 50  mcg by mouth daily before breakfast.   Yes Historical Provider, MD  omeprazole (PRILOSEC) 20 MG capsule Take 20 mg by mouth daily.   Yes Historical Provider, MD  ropinirole (REQUIP) 5 MG tablet Take 5 mg by mouth at bedtime.   Yes Historical Provider, MD  sertraline (ZOLOFT) 100 MG tablet Take 100 mg by mouth 2 (two) times daily.   Yes Historical Provider, MD   Physical Exam: Filed Vitals:   10/06/2014 1332 10/13/2014 1345 09/23/2014 1400 09/18/2014 1445  BP: 153/83 130/65 130/73 151/69  Pulse: 74 73 85 74  Temp:    98 F (36.7 C)  TempSrc:    Oral  Resp: 19 21  16   Height:    5\' 2"  (1.575 m)  Weight:    38.329 kg (84 lb 8 oz)  SpO2: 100% 98% 93% 100%    Wt Readings from Last 3 Encounters:  10/06/2014 38.329 kg (84 lb 8 oz)  07/29/14 37.649 kg (83 lb)   05/03/14 39.009 kg (86 lb)    General:  Appears calm and comfortable Eyes: PERRL, normal lids, irises & conjunctiva ENT: grossly normal hearing, lips & tongue Neck: no LAD, masses or thyromegaly Cardiovascular: RRR, no m/r/g. No LE edema. Respiratory: CTA bilaterally, no w/r/r. Normal respiratory effort. Abdomen: soft, ntnd Skin: no rash or induration seen on limited exam Musculoskeletal: grossly normal tone BUE/B Neurologic: facila droop. Alert  Confused.  Able to move all extremities.           Labs on Admission:  Basic Metabolic Panel:  Recent Labs Lab 09/25/2014 1144 10/03/2014 1148  NA 140 138  K 3.7 3.5*  CL 100 101  CO2 27  --   GLUCOSE 95 95  BUN 21 21  CREATININE 0.91 1.00  CALCIUM 9.1  --    Liver Function Tests:  Recent Labs Lab 10/09/2014 1144  AST 15  ALT 6  ALKPHOS 136*  BILITOT <0.2*  PROT 7.0  ALBUMIN 3.5   No results found for this basename: LIPASE, AMYLASE,  in the last 168 hours No results found for this basename: AMMONIA,  in the last 168 hours CBC:  Recent Labs Lab 10/03/2014 1144 10/06/2014 1148  WBC 8.8  --   NEUTROABS 7.4  --   HGB 11.5* 12.9  HCT 35.3* 38.0  MCV 89.1  --   PLT 262  --    Cardiac Enzymes: No results found for this basename: CKTOTAL, CKMB, CKMBINDEX, TROPONINI,  in the last 168 hours  BNP (last 3 results) No results found for this basename: PROBNP,  in the last 8760 hours CBG:  Recent Labs Lab 10/05/2014 1211  GLUCAP 107*    Radiological Exams on Admission: Ct Head Wo Contrast  09/18/2014   CLINICAL DATA:  Left-sided weakness and aphasia  EXAM: CT HEAD WITHOUT CONTRAST  TECHNIQUE: Contiguous axial images were obtained from the base of the skull through the vertex without intravenous contrast.  COMPARISON:  06/10/2007  FINDINGS: The bony calvarium is intact. Diffuse atrophic changes are again identified as are chronic white matter ischemic changes. No findings to suggest acute hemorrhage, acute infarction or  space-occupying mass lesion are identified.  IMPRESSION: Chronic changes without acute abnormality.  These results were called by telephone at the time of interpretation on 09/26/2014 at 12:11 pm to Dr. Armida Sans , who verbally acknowledged these results.   Electronically Signed   By: Inez Catalina M.D.   On: 10/03/2014 12:11    EKG: sinus rhythm Assessment/Plan Active Problems:  Acute thromboembolic cerebrovascular accident   CVA (cerebral infarction)  Left sided weakness and facial droop: Admit to telemetry. Stroke work up ordered. Patient failed swallow eval.  Rectal aspirin ordered. Gentle hydration in am. Neurology consulted and recommendations to follow.    Hypothyroidism: Resume synthroid.    Hypokalemia replete as needed.     Code Status: presumed full code as no family  at bedside.  DVT Prophylaxis: Family Communication: none at bedside, called and left message.  Disposition Plan: admit to telemetry.  Time spent: 55 min  [facility] Pager [phone]

## 2014-10-04 NOTE — ED Provider Notes (Signed)
CSN: [id number]     Arrival date & time 09/16/2014  1140 History   None    Chief Complaint  Patient presents with  . Code Stroke     (Consider location/radiation/quality/duration/timing/severity/associated sxs/prior Treatment) HPI Comments: Pt comes in with cc of L sided weakness and droop. Pt has hx of HTN, no hx of strokes, DM. Per son, pt last normal yday. She started having some slurred speech and left sided facial droop. ASA given. PT never went to baseline status. This morning, her sx got worse, and pt was unable to walk. Pt currently has no complains. AO x 3.  The history is provided by the patient.    Past Medical History  Diagnosis Date  . Blood in stool   . FHx: allergies   . Thyroid condition   . Diverticulitis   . Anemia   . Anxiety   . Depression   . History of orthostatic hypotension   . Restless leg syndrome   . Insomnia   . Osteopenia   . GERD (gastroesophageal reflux disease)   . Hemorrhoidal skin tag   . Hyperlipidemia   . Hypothyroidism   . Diverticulosis   . Hemorrhoids   . Chronic urethritis   . Weight loss   . Peripheral neuropathy   . Cervical cancer 1960  . Arthritis     hands   . Dysphagia   . Hiatal hernia    Past Surgical History  Procedure Laterality Date  . Abdominal hysterectomy  1960  . Hand surgery      bilateral  . Hemorrhoid surgery    . Cataract extraction      Bilaterally  . Ercp  04/16/2012    Procedure: ENDOSCOPIC RETROGRADE CHOLANGIOPANCREATOGRAPHY (ERCP);  Surgeon: Inda Castle, MD;  Location: [facility];  Service: Gastroenterology;  Laterality: N/A;  . Ercp    . Colonoscopy    . Breast lumpectomy      left  . Egd with dilation  06/2012    Dr. Deatra Ina  . Esophagogastroduodenoscopy  07/28/2012    Procedure: ESOPHAGOGASTRODUODENOSCOPY (EGD);  Surgeon: Jerene Bears, MD;  Location: [facility];  Service: Gastroenterology;  Laterality: N/A;  balloon dilation  . Balloon dilation  07/28/2012    Procedure: BALLOON DILATION;   Surgeon: Jerene Bears, MD;  Location: [facility];  Service: Gastroenterology;  Laterality: N/A;   Family History  Problem Relation Age of Onset  . Colon cancer Brother   . Stroke Father   . Dementia Sister     x 2  . Diabetes Cousin   . COPD Daughter    History  Substance Use Topics  . Smoking status: Never Smoker   . Smokeless tobacco: Never Used  . Alcohol Use: Yes     Comment: about one glass of wine q63mo    OB History   Grav Para Term Preterm Abortions TAB SAB Ect Mult Living                 Review of Systems  Unable to perform ROS: Acuity of condition  Constitutional: Positive for activity change.  Respiratory: Negative for shortness of breath.   Cardiovascular: Negative for chest pain.  Gastrointestinal: Negative for nausea, vomiting and abdominal pain.  Genitourinary: Negative for dysuria.  Musculoskeletal: Positive for gait problem. Negative for neck pain.  Neurological: Positive for facial asymmetry, speech difficulty and weakness. Negative for seizures, numbness and headaches.      Allergies  Codeine phosphate and Tetanus toxoid adsorbed  Home  Medications   Prior to Admission medications   Medication Sig Start Date End Date Taking? Authorizing Provider  ALPRAZolam Duanne Moron) 1 MG tablet Take 1 tablet (1 mg total) by mouth 3 (three) times daily as needed for anxiety. 06/10/14   Aleksei Plotnikov V, MD  butalbital-acetaminophen-caffeine (FIORICET, ESGIC) 50-325-40 MG per tablet TAKE 1 TABLET THREE TIMES DAILY AS NEEDED FOR HEADACHE    Aleksei Plotnikov V, MD  clotrimazole-betamethasone (LOTRISONE) cream Apply 1 application topically as directed.     Historical Provider, MD  diltiazem 2 % GEL Apply 1 application topically 2 (two) times daily. 12/31/12   Aleksei Plotnikov V, MD  Diltiazem HCl POWD APPLY TO PERIANAL AREA TWICE DAILY. 11/17/13   Aleksei Plotnikov V, MD  Fluticasone Furoate-Vilanterol (BREO ELLIPTA) 100-25 MCG/INH AEPB Inhale 1 Act into the lungs daily.  05/03/14   Aleksei Plotnikov V, MD  gabapentin (NEURONTIN) 100 MG capsule Take 1-2 capsules (100-200 mg total) by mouth at bedtime as needed. 11/23/13   Aleksei Plotnikov V, MD  HYDROcodone-homatropine (HYCODAN) 5-1.5 MG/5ML syrup Take 5 mLs by mouth every 6 (six) hours as needed for cough. 05/03/14   Aleksei Plotnikov V, MD  hydrOXYzine (ATARAX/VISTARIL) 50 MG tablet TAKE 1 OR 2 TABLETS AT BEDTIME AS NEEDED. 08/24/13   Lew Dawes V, MD  meclizine (ANTIVERT) 12.5 MG tablet Take 1 tablet (12.5 mg total) by mouth 2 (two) times daily as needed for dizziness. 09/12/14   Aleksei Plotnikov V, MD  megestrol (MEGACE ES) 625 MG/5ML suspension Take 5 mLs (625 mg total) by mouth daily. 03/14/14   Aleksei Plotnikov V, MD  omeprazole (PRILOSEC) 20 MG capsule TAKE 1 CAPSULE DAILY. 07/19/14   Aleksei Plotnikov V, MD  pramoxine-hydrocortisone (ANALPRAM HC) cream Apply topically 2 (two) times daily.      Historical Provider, MD  ropinirole (REQUIP) 5 MG tablet TAKE ONE TABLET AT BEDTIME.    Aleksei Plotnikov V, MD  sertraline (ZOLOFT) 100 MG tablet TAKE 1 TABLET TWICE DAILY. 07/29/14   Aleksei Plotnikov V, MD  SYNTHROID 50 MCG tablet TAKE 1 TABLET ONCE DAILY. 07/14/14   Aleksei Plotnikov V, MD   BP 146/76  Pulse 83  Temp(Src) 97.8 F (36.6 C) (Oral)  Resp 23  SpO2 98% Physical Exam  Nursing note and vitals reviewed. Constitutional: She is oriented to person, place, and time. She appears well-developed and well-nourished.  HENT:  Head: Normocephalic and atraumatic.  Eyes: EOM are normal. Pupils are equal, round, and reactive to light.  Neck: Neck supple.  Cardiovascular: Normal rate, regular rhythm and normal heart sounds.   No murmur heard. Pulmonary/Chest: Effort normal. No respiratory distress.  Abdominal: Soft. She exhibits no distension. There is no tenderness. There is no rebound and no guarding.  Neurological: She is alert and oriented to person, place, and time.  Cerebellar exam is normal (finger to  nose). Sensory exam normal for bilateral upper and lower extremities. LUE, LLE weakness appreciated, with a facial droop.  Skin: Skin is warm and dry.    ED Course  Procedures (including critical care time) Labs Review Labs Reviewed  CBC - Abnormal; Notable for the following:    Hemoglobin 11.5 (*)    HCT 35.3 (*)    All other components within normal limits  DIFFERENTIAL - Abnormal; Notable for the following:    Neutrophils Relative % 84 (*)    Lymphocytes Relative 9 (*)    All other components within normal limits  COMPREHENSIVE METABOLIC PANEL - Abnormal; Notable for the following:  Alkaline Phosphatase 136 (*)    Total Bilirubin <0.2 (*)    GFR calc non Af Amer 52 (*)    GFR calc Af Amer 60 (*)    All other components within normal limits  I-STAT CHEM 8, ED - Abnormal; Notable for the following:    Potassium 3.5 (*)    All other components within normal limits  CBG MONITORING, ED - Abnormal; Notable for the following:    Glucose-Capillary 107 (*)    All other components within normal limits  ETHANOL  PROTIME-INR  APTT  URINE RAPID DRUG SCREEN (HOSP PERFORMED)  URINALYSIS, ROUTINE W REFLEX MICROSCOPIC  I-STAT TROPOININ, ED  I-STAT TROPOININ, ED    Imaging Review Ct Head Wo Contrast  10/06/2014   CLINICAL DATA:  Left-sided weakness and aphasia  EXAM: CT HEAD WITHOUT CONTRAST  TECHNIQUE: Contiguous axial images were obtained from the base of the skull through the vertex without intravenous contrast.  COMPARISON:  06/10/2007  FINDINGS: The bony calvarium is intact. Diffuse atrophic changes are again identified as are chronic white matter ischemic changes. No findings to suggest acute hemorrhage, acute infarction or space-occupying mass lesion are identified.  IMPRESSION: Chronic changes without acute abnormality.  These results were called by telephone at the time of interpretation on 09/22/2014 at 12:11 pm to Dr. Armida Sans , who verbally acknowledged these results.    Electronically Signed   By: Inez Catalina M.D.   On: 09/28/2014 12:11     EKG Interpretation None       Date: 10/09/2014  Rate: 79  Rhythm: normal sinus rhythm  QRS Axis: normal  Intervals: normal  ST/T Wave abnormalities: normal  Conduction Disutrbances: none  Narrative Interpretation: unremarkable     MDM   Final diagnoses:  Acute ischemic stroke  Acute embolic stroke   DDx includes:  Stroke - ischemic vs. hemorrhagic TIA Neuropathy Myelitis Electrolyte abnormality Neuropathy Muscular disease  Pt with hx of htn comes in with a fall, noted noted to left sided deficits. Concerns for stroke. CODE STROKE activated - as it was initially reported that patient was last normal at 11:15 am. However, further hx reveals last normal y'day.  Interval: Other (Comment) (10/20 1206) Level of Consciousness (1a. ): Alert, keenly responsive (10/20 1206) LOC Questions (1b. ): Answers both questions correctly (10/20 1206) LOC Commands (1c. ): Performs both tasks correctly (10/20 1206) Best Gaze (2. ): Partial gaze palsy (10/20 1206) Visual (3. ): Complete hemianopia (10/20 1206) Facial Palsy (4. ): Minor paralysis (10/20 1206) Motor Arm, Left (5a. ): No drift (10/20 1206) Motor Arm, Right (5b. ): No drift (10/20 1206) Motor Leg, Left (6a. ): No drift (10/20 1206) Motor Leg, Right (6b. ): No drift (10/20 1206) Limb Ataxia (7. ): Absent (10/20 1206) Sensory (8. ): Normal, no sensory loss (10/20 1206) Best Language (9. ): No aphasia (10/20 1206) Dysarthria (10. ): Mild-to-moderate dysarthria, patient slurs at least some words and, at worst, can be understood with some difficulty (10/20 1206) Inattention/Extinction: No Abnormality (10/20 1206) Total: 5 (10/20 1206)  NIHSS - 5.  Will admit for stroke workup.     Varney Biles, MD 09/16/2014 1302

## 2014-10-05 ENCOUNTER — Inpatient Hospital Stay (HOSPITAL_COMMUNITY): Payer: Medicare PPO

## 2014-10-05 DIAGNOSIS — I369 Nonrheumatic tricuspid valve disorder, unspecified: Secondary | ICD-10-CM

## 2014-10-05 LAB — URINALYSIS, ROUTINE W REFLEX MICROSCOPIC
Glucose, UA: NEGATIVE mg/dL
Hgb urine dipstick: NEGATIVE
KETONES UR: NEGATIVE mg/dL
LEUKOCYTES UA: NEGATIVE
Nitrite: NEGATIVE
PROTEIN: 30 mg/dL — AB
Specific Gravity, Urine: 1.028 (ref 1.005–1.030)
UROBILINOGEN UA: 0.2 mg/dL (ref 0.0–1.0)
pH: 5.5 (ref 5.0–8.0)

## 2014-10-05 LAB — HEMOGLOBIN A1C
Hgb A1c MFr Bld: 6.3 % — ABNORMAL HIGH (ref <5.7)
Mean Plasma Glucose: 134 mg/dL — ABNORMAL HIGH (ref <117)

## 2014-10-05 LAB — LIPID PANEL
CHOL/HDL RATIO: 3.1 ratio
CHOLESTEROL: 248 mg/dL — AB (ref 0–200)
HDL: 79 mg/dL (ref >39)
LDL Cholesterol: 144 mg/dL — ABNORMAL HIGH (ref 0–99)
TRIGLYCERIDES: 126 mg/dL (ref <150)
VLDL: 25 mg/dL (ref 0–40)

## 2014-10-05 LAB — RAPID URINE DRUG SCREEN, HOSP PERFORMED
Amphetamines: NOT DETECTED
BENZODIAZEPINES: POSITIVE — AB
Barbiturates: POSITIVE — AB
Cocaine: NOT DETECTED
Opiates: POSITIVE — AB
Tetrahydrocannabinol: NOT DETECTED

## 2014-10-05 LAB — URINE MICROSCOPIC-ADD ON

## 2014-10-05 LAB — TSH: TSH: 2.19 u[IU]/mL (ref 0.350–4.500)

## 2014-10-05 MED ORDER — HYDRALAZINE HCL 20 MG/ML IJ SOLN
5.0000 mg | Freq: Once | INTRAMUSCULAR | Status: AC
  Administered 2014-10-05: 5 mg via INTRAVENOUS
  Filled 2014-10-05: qty 1

## 2014-10-05 MED ORDER — POLYVINYL ALCOHOL 1.4 % OP SOLN
1.0000 [drp] | OPHTHALMIC | Status: DC | PRN
  Administered 2014-10-05: 1 [drp] via OPHTHALMIC
  Filled 2014-10-05: qty 15

## 2014-10-05 MED ORDER — ASPIRIN 81 MG PO CHEW
81.0000 mg | CHEWABLE_TABLET | Freq: Every day | ORAL | Status: DC
  Administered 2014-10-05 – 2014-10-06 (×2): 81 mg via ORAL
  Filled 2014-10-05 (×2): qty 1

## 2014-10-05 MED ORDER — ATORVASTATIN CALCIUM 40 MG PO TABS
40.0000 mg | ORAL_TABLET | Freq: Every day | ORAL | Status: DC
  Administered 2014-10-05 – 2014-10-06 (×2): 40 mg via ORAL
  Filled 2014-10-05 (×2): qty 1

## 2014-10-05 NOTE — Evaluation (Signed)
Occupational Therapy Evaluation Patient Details Name: Alexis Smith MRN: [id number] DOB: May 11, 1916 Today's Date: 10/05/2014    History of Present Illness 80 y.o. female admitted to [facility] on 10/08/2014 with left sided weakness and left facial droop.  MRI revealed acute infarction affecting the right basal ganglia and corona radiata.  Pt with significant PMHx of anemia, anxiety/depression, orthostatic hypotension, insomnia, peripheral neuropathy, dysphasia, and EGD with dialation (2013).     Clinical Impression   PT admitted with L side weakness and L facial droop. MRI (+) R basal ganglia and corona radiata. Pt currently with functional limitiations due to the deficits listed below (see OT problem list). PTA pt walked with cane and mod I level and needed min (A) for adls from aide. Pt will benefit from skilled OT to increase their independence and safety with adls and balance to allow discharge SNF. Question families level to care for patient upon d/c home. Spouse is elderly and can not transfer patient at current level. OT to follow acutely for adl retraining and balance.     Follow Up Recommendations  SNF;Supervision/Assistance - 24 hour (unless family can provide higher level care. spouse can not)    Equipment Recommendations  Other (comment) (defer)    Recommendations for Other Services       Precautions / Restrictions Precautions Precautions: Fall Precaution Comments: pt is very weak and unsteady on her feet.       Mobility Bed Mobility Overal bed mobility: Needs Assistance Bed Mobility: Supine to Sit     Supine to sit: Mod assist     General bed mobility comments: in chair on arrival  Transfers Overall transfer level: Needs assistance Equipment used: Rolling walker (2 wheeled) Transfers: Sit to/from Stand Sit to Stand: Mod assist         General transfer comment: cues for hand placement and static standing. pt attempting to return to sitting quickly    Balance  Overall balance assessment: Needs assistance Sitting-balance support: Bilateral upper extremity supported;Feet supported Sitting balance-Leahy Scale: Poor Sitting balance - Comments: supervision seated EOB once feet supported on the ground Postural control: Posterior lean Standing balance support: Bilateral upper extremity supported;During functional activity Standing balance-Leahy Scale: Poor Standing balance comment: Pt was unable to stand without external support.                             ADL Overall ADL's : Needs assistance/impaired Eating/Feeding: Minimal assistance;Sitting Eating/Feeding Details (indicate cue type and reason): needs cues to decr speed, check for pocketing. pt coughing and nasal drainage with pudding vanilla (husband reports this flavor is her favorite) Grooming: Wash/dry face;Minimal assistance;Sitting Grooming Details (indicate cue type and reason): cues to wipe nose due to drainage immediately following a cough                               General ADL Comments: Pt in chair on arrival and visiting with extended family and spouse. pt engaged in eating pudding and drinking small amount of ensure. pt with minimal PO intake of lunch. OT offering sweet in hopes of engaging patient in PO intake. Pt self feeding food and enjoying the taste. Pt verbalized "thank you" pt noted to have strong posterior lean with sit to stand x2. Pt s spouse very encouraged watching patient this session and agrees this is not baseline.     Vision  Additional Comments: pt with R gaze and will track L with neck rotation with name call. Pt waving bye appropriately with therapist walking toward doorway   Perception     Praxis      Pertinent Vitals/Pain Pain Assessment: No/denies pain     Hand Dominance Right   Extremity/Trunk Assessment Upper Extremity Assessment Upper Extremity Assessment: Overall WFL for tasks assessed;Difficult to  assess due to impaired cognition (functional assessment with self feeding)   Lower Extremity Assessment Lower Extremity Assessment: Defer to PT evaluation   Cervical / Trunk Assessment Cervical / Trunk Assessment: Kyphotic   Communication Communication Communication: Expressive difficulties;HOH   Cognition Arousal/Alertness: Awake/alert Behavior During Therapy: WFL for tasks assessed/performed Overall Cognitive Status: Difficult to assess Area of Impairment: Following commands Orientation Level: Disoriented to;Time (unable to tell me the year, but was able to say "October") Current Attention Level: Sustained   Following Commands: Follows one step commands consistently;Follows one step commands with increased time     Problem Solving: Slow processing;Requires verbal cues;Requires tactile cues General Comments: Pt with limited verbal responses but pointing and patting therapist for attention. pt with R visual attention and gaze preference   General Comments       Exercises       Shoulder Instructions      Home Living Family/patient expects to be discharged to:: Skilled nursing facility                                    Lives With: Spouse (at daughter s home)    Prior Functioning/Environment Level of Independence: Independent with assistive device(s)        Comments: ambulates with cane at home and has an aide for bathing/ dressing. Pt was able to ambulate without any assistance    OT Diagnosis: Generalized weakness;Cognitive deficits   OT Problem List: Decreased strength;Decreased activity tolerance;Impaired balance (sitting and/or standing);Decreased knowledge of use of DME or AE;Decreased knowledge of precautions;Decreased safety awareness;Impaired vision/perception   OT Treatment/Interventions: Self-care/ADL training;Therapeutic exercise;Neuromuscular education;DME and/or AE instruction;Therapeutic activities;Cognitive  remediation/compensation;Balance training;Patient/family education;Visual/perceptual remediation/compensation    OT Goals(Current goals can be found in the care plan section) Acute Rehab OT Goals Patient Stated Goal: unable to state- husband reports when she is mad at me she plays the piano. she loves to play that piano OT Goal Formulation: With patient/family Time For Goal Achievement: 10/19/14 Potential to Achieve Goals: Good  OT Frequency: Min 2X/week   Barriers to D/C:            Co-evaluation              End of Session Equipment Utilized During Treatment: Gait belt;Rolling walker Nurse Communication: Mobility status;Precautions  Activity Tolerance: Patient tolerated treatment well Patient left: in chair;with call bell/phone within reach;with chair alarm set;with family/visitor present   Time: 1410-1431 OT Time Calculation (min): 21 min Charges:  OT General Charges $OT Visit: 1 Procedure OT Evaluation $Initial OT Evaluation Tier I: 1 Procedure OT Treatments $Self Care/Home Management : 8-22 mins G-Codes:    Peri Maris 2014/10/08, 4:40 PM Pager: [phone]

## 2014-10-05 NOTE — Progress Notes (Signed)
Echo Lab  2D Echocardiogram completed.  [place], RDCS 10/05/2014 12:00 PM

## 2014-10-05 NOTE — Progress Notes (Signed)
Physical medicine and rehabilitation consult requested. Await neurology workup to be completed. Physical occupational therapy evaluations pending. Followup with formal rehabilitation consult after evaluations completed recommendations to follow

## 2014-10-05 NOTE — Evaluation (Signed)
Speech Language Pathology Evaluation Patient Details Name: Alexis Smith MRN: [id number] DOB: Apr 30, 1916 Today's Date: 10/05/2014 Time: 5456-2563 SLP Time Calculation (min): 27 min  Problem List:  Patient Active Problem List   Diagnosis Date Noted  . Acute thromboembolic cerebrovascular accident 09/18/2014  . CVA (cerebral infarction) 09/25/2014  . Cough 04/25/2014  . Sweating 02/03/2013  . Irritable bladder 11/25/2012  . Esophageal stricture 07/16/2012  . Odynophagia 07/15/2012  . Abdominal pain, other specified site 07/06/2012  . Anorexia 06/05/2012  . Obstruction of bile duct 04/16/2012  . Change in bowel function 04/08/2012  . Jaundice 04/07/2012  . Constipation 02/12/2012  . Hearing loss 02/12/2012  . Chronic UTI 01/13/2012  . Dysphagia 01/13/2012  . Weight loss, abnormal 11/13/2011  . Paresthesia and pain of extremity 11/13/2011  . Osteopenia 11/13/2011  . ANEMIA 01/03/2011  . RHINITIS 01/03/2011  . HYPOTENSION, ORTHOSTATIC, HX OF 01/03/2011  . ANXIETY DEPRESSION 07/31/2010  . RESTLESS LEG SYNDROME 07/31/2010  . PERIPHERAL NEUROPATHY 11/28/2009  . ECZEMA 11/28/2009  . INSOMNIA 11/28/2009  . RESIDUAL HEMORRHOIDAL SKIN TAGS 06/08/2009  . Essential hypertension, benign 09/20/2008  . GERD 09/20/2008  . HYPOTHYROIDISM 02/23/2008  . HYPERLIPIDEMIA 02/23/2008  . DIZZINESS, CHRONIC 02/23/2008  . HEADACHE 02/23/2008  . DIVERTICULITIS, HX OF 08/31/2007   Past Medical History:  Past Medical History  Diagnosis Date  . Blood in stool   . FHx: allergies   . Thyroid condition   . Diverticulitis   . Anemia   . Anxiety   . Depression   . History of orthostatic hypotension   . Restless leg syndrome   . Insomnia   . Osteopenia   . GERD (gastroesophageal reflux disease)   . Hemorrhoidal skin tag   . Hyperlipidemia   . Hypothyroidism   . Diverticulosis   . Hemorrhoids   . Chronic urethritis   . Weight loss   . Peripheral neuropathy   . Cervical cancer 1960  .  Arthritis     hands   . Dysphagia   . Hiatal hernia    Past Surgical History:  Past Surgical History  Procedure Laterality Date  . Abdominal hysterectomy  1960  . Hand surgery      bilateral  . Hemorrhoid surgery    . Cataract extraction      Bilaterally  . Ercp  04/16/2012    Procedure: ENDOSCOPIC RETROGRADE CHOLANGIOPANCREATOGRAPHY (ERCP);  Surgeon: Inda Castle, MD;  Location: [facility];  Service: Gastroenterology;  Laterality: N/A;  . Ercp    . Colonoscopy    . Breast lumpectomy      left  . Egd with dilation  06/2012    Dr. Deatra Ina  . Esophagogastroduodenoscopy  07/28/2012    Procedure: ESOPHAGOGASTRODUODENOSCOPY (EGD);  Surgeon: Jerene Bears, MD;  Location: [facility];  Service: Gastroenterology;  Laterality: N/A;  balloon dilation  . Balloon dilation  07/28/2012    Procedure: BALLOON DILATION;  Surgeon: Jerene Bears, MD;  Location: [facility];  Service: Gastroenterology;  Laterality: N/A;   HPI:  Alexis Smith is a 80 y.o. female with prior h/o hypothyroidism,GERD, esophageal dysphagia s/p EGD with balloon dilation x2, and HH. She was brought in 09/29/2014 for worsening left sided weakness and left facial droop that began the day before. 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   Assessment / Plan / Recommendation Clinical Impression  Pt presents  with disorientation x4, requiring Max cues from SLP to increase orientation. She also shows moderate deficits with sustained attention, basic problem solving, emergent awareness, and recall of new information. SLP provided Min cues to follow one-step commands. While confrontational naming was intact, delayed processing and cognitive impairments are likely contributing to decreased verbal output. Speech intelligibility is mildly reduced and primarily impacted by low vocal intensity. Pt will  benefit from skilled SLP services to maximize functional cognition and communication.    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))   Cognition  Overall Cognitive Status: Impaired/Different from baseline Arousal/Alertness: Awake/alert Orientation Level: Disoriented X4 (requires Max cues from SLP) Attention: Sustained Sustained Attention: Impaired Sustained Attention Impairment: Verbal basic;Functional basic Memory: Impaired Memory Impairment: Decreased recall of new information;Storage deficit;Retrieval deficit Awareness: Impaired Awareness Impairment: Emergent impairment;Anticipatory impairment Problem Solving: Impaired Problem Solving Impairment: Functional basic    Comprehension  Auditory Comprehension Overall Auditory Comprehension: Impaired Yes/No Questions: Within Functional Limits (for basic yes/no questions) Commands: Impaired One Step Basic Commands: 75-100% accurate Conversation: Simple Interfering Components: Attention;Processing speed;Working memory EffectiveTechniques: Visual/Gestural cues;Extra processing time;Increased volume Visual Recognition/Discrimination Discrimination: Not tested Reading Comprehension Reading Status: Not tested    Expression Expression Primary Mode of Expression: Verbal Verbal Expression Overall Verbal Expression: Impaired Initiation: Impaired Automatic Speech: Name;Social Response Level of  Generative/Spontaneous Verbalization: Phrase;Sentence Naming: No impairment (with confrontational naming) Interfering Components: Attention Non-Verbal Means of Communication: Not applicable Written Expression Written Expression: Not tested   Oral / Motor Oral Motor/Sensory Function Overall Oral Motor/Sensory Function: Impaired Labial ROM: Within Functional Limits Labial Symmetry: Within Functional Limits Labial Strength: Reduced Lingual ROM: Within Functional Limits Lingual Symmetry: Within Functional Limits Lingual Strength: Reduced Facial ROM: Reduced left Facial Symmetry: Left droop Facial Strength: Reduced Motor Speech Overall Motor Speech: Impaired Respiration: Impaired Level of Impairment: Phrase Phonation: Low vocal intensity;Other (comment) (intermittently strained) Resonance: Within functional limits Articulation: Within functional limitis Intelligibility: Intelligibility reduced Word: 75-100% accurate Phrase: 75-100% accurate Sentence: 75-100% accurate Motor Planning: Witnin functional limits Motor Speech Errors: Not applicable   GO      Germain Osgood, M.A. CCC-SLP [phone]  Germain Osgood 10/05/2014, 10:19 AM

## 2014-10-05 NOTE — Progress Notes (Signed)
STROKE TEAM PROGRESS NOTE   HISTORY Alexis Smith is an 80 y.o. female brought to ED after family member noted she had a left facial droop and left sided weakness. Patient is unable to give good history but family states they noticed a left facial droop and decreased vision last night 10/03/2014 which did not resolve. Apparently she was left alone this AM 10/06/2014 and when husband returned found her on the floor with left sided weakness. The left side weakness has resolved. Currently patient shows a left facial droop, left hemianopia, dysarthria and right gaze preference. Patient was not a tPA candidate due to unknown LSN. She was admitted for further evaluation and treatment.   SUBJECTIVE (INTERVAL HISTORY) Her husband is at the bedside.  Overall she feels her condition is stable. Her daughter recently died, under the care of hospice, funeral is today. Patient and her husband took care of her daughter.   OBJECTIVE Temp:  [98 F (36.7 C)-98.6 F (37 C)] 98.2 F (36.8 C) (10/21 1009) Pulse Rate:  [73-108] 106 (10/21 1009) Cardiac Rhythm:  [-] Normal sinus rhythm (10/20 2000) Resp:  [15-21] 18 (10/21 1009) BP: (130-216)/(65-127) 190/86 mmHg (10/21 1009) SpO2:  [93 %-100 %] 100 % (10/21 1009) Weight:  [38.329 kg (84 lb 8 oz)] 38.329 kg (84 lb 8 oz) (10/20 1445)   Recent Labs Lab 09/29/2014 1211  GLUCAP 107*    Recent Labs Lab 09/22/2014 1144 10/06/2014 1148  NA 140 138  K 3.7 3.5*  CL 100 101  CO2 27  --   GLUCOSE 95 95  BUN 21 21  CREATININE 0.91 1.00  CALCIUM 9.1  --     Recent Labs Lab 09/20/2014 1144  AST 15  ALT 6  ALKPHOS 136*  BILITOT <0.2*  PROT 7.0  ALBUMIN 3.5    Recent Labs Lab 09/29/2014 1144 10/06/2014 1148  WBC 8.8  --   NEUTROABS 7.4  --   HGB 11.5* 12.9  HCT 35.3* 38.0  MCV 89.1  --   PLT 262  --    No results found for this basename: CKTOTAL, CKMB, CKMBINDEX, TROPONINI,  in the last 168 hours  Recent Labs  09/17/2014 1144  LABPROT 13.8  INR 1.05     Recent Labs  09/27/2014 2358  COLORURINE YELLOW  LABSPEC 1.028  PHURINE 5.5  GLUCOSEU NEGATIVE  HGBUR NEGATIVE  BILIRUBINUR SMALL*  KETONESUR NEGATIVE  PROTEINUR 30*  UROBILINOGEN 0.2  NITRITE NEGATIVE  LEUKOCYTESUR NEGATIVE       Component Value Date/Time   CHOL 248* 10/05/2014 0450   TRIG 126 10/05/2014 0450   HDL 79 10/05/2014 0450   CHOLHDL 3.1 10/05/2014 0450   VLDL 25 10/05/2014 0450   LDLCALC 144* 10/05/2014 0450   Lab Results  Component Value Date   HGBA1C 6.3* 10/05/2014      Component Value Date/Time   LABOPIA POSITIVE* 10/03/2014 2358   COCAINSCRNUR NONE DETECTED 10/12/2014 2358   LABBENZ POSITIVE* 09/19/2014 2358   AMPHETMU NONE DETECTED 10/06/2014 2358   THCU NONE DETECTED 10/15/2014 2358   LABBARB POSITIVE* 09/29/2014 2358     Recent Labs Lab 10/13/2014 1144  ETH <11    Dg Chest 2 View  10/05/2014   CLINICAL DATA:  Patient is week and short of breath.  Stroke.  EXAM: CHEST  2 VIEW  COMPARISON:  04/25/2014.  FINDINGS: Cardiac silhouette is normal in size and configuration. Aorta is mildly uncoiled. Moderate size hiatal hernia. No mediastinal or hilar masses.  Lungs are  mildly hyperexpanded but clear. No pleural effusion or pneumothorax  Bony thorax is demineralized but intact.  IMPRESSION: No acute cardiopulmonary disease.   Electronically Signed   By: Lajean Manes M.D.   On: 10/05/2014 08:32   Ct Head Wo Contrast  09/27/2014   CLINICAL DATA:  Left-sided weakness and aphasia  EXAM: CT HEAD WITHOUT CONTRAST  TECHNIQUE: Contiguous axial images were obtained from the base of the skull through the vertex without intravenous contrast.  COMPARISON:  06/10/2007  FINDINGS: The bony calvarium is intact. Diffuse atrophic changes are again identified as are chronic white matter ischemic changes. No findings to suggest acute hemorrhage, acute infarction or space-occupying mass lesion are identified.  IMPRESSION: Chronic changes without acute abnormality.  These  results were called by telephone at the time of interpretation on 10/02/2014 at 12:11 pm to Dr. Armida Sans , who verbally acknowledged these results.   Electronically Signed   By: Inez Catalina M.D.   On: 10/13/2014 12:11   Mr Brain Wo Contrast  10/05/2014   CLINICAL DATA:  Worsening of left-sided weakness. Left facial droop. Onset yesterday. Found on the floor.  EXAM: MRI HEAD WITHOUT CONTRAST  MRA HEAD WITHOUT CONTRAST  TECHNIQUE: Multiplanar, multiecho pulse sequences of the brain and surrounding structures were obtained without intravenous contrast. Angiographic images of the head were obtained using MRA technique without contrast.  COMPARISON:  Head CT 10/05/2014.  MRI 07/19/2007.  FINDINGS: MRI HEAD FINDINGS  There is a 2.5 cm region of acute infarction affecting the right basal ganglia and corona radiata region. No swelling, hemorrhage or mass effect. No other acute infarction. Chronic small-vessel ischemic changes affect the pons. No focal cerebellar insult. There chronic small-vessel ischemic changes affecting the thalami, basal ganglia and throughout the cerebral hemispheric white matter. No cortical or large vessel territory infarction. No mass lesion, hemorrhage, hydrocephalus or extra-axial collection. No pituitary mass. No inflammatory sinus disease. No skull or skullbase lesion.  MRA HEAD FINDINGS  Both internal carotid arteries are widely patent into the brain. No siphon stenosis. The anterior and middle cerebral vessels are patent bilaterally without proximal stenosis, aneurysm or vascular malformation.  Both vertebral arteries are widely patent to the basilar. No basilar stenosis. Posterior circulation branch vessels are patent an unremarkable. Large posterior communicating artery noted on the right.  Distal vessels show some atherosclerotic irregularity.  IMPRESSION: 2.5 cm acute infarction affecting the right basal ganglia and corona radiata region. No mass effect or hemorrhage.  Extensive chronic  small vessel ischemic changes elsewhere throughout the brain.  No large or medium vessel occlusion or correctable proximal stenosis. Distal vessel atherosclerotic irregularity.   Electronically Signed   By: Nelson Chimes M.D.   On: 10/05/2014 09:23   Mr Jodene Nam Head/brain Wo Cm  10/05/2014   CLINICAL DATA:  Worsening of left-sided weakness. Left facial droop. Onset yesterday. Found on the floor.  EXAM: MRI HEAD WITHOUT CONTRAST  MRA HEAD WITHOUT CONTRAST  TECHNIQUE: Multiplanar, multiecho pulse sequences of the brain and surrounding structures were obtained without intravenous contrast. Angiographic images of the head were obtained using MRA technique without contrast.  COMPARISON:  Head CT 10/11/2014.  MRI 07/19/2007.  FINDINGS: MRI HEAD FINDINGS  There is a 2.5 cm region of acute infarction affecting the right basal ganglia and corona radiata region. No swelling, hemorrhage or mass effect. No other acute infarction. Chronic small-vessel ischemic changes affect the pons. No focal cerebellar insult. There chronic small-vessel ischemic changes affecting the thalami, basal ganglia and throughout the cerebral  hemispheric white matter. No cortical or large vessel territory infarction. No mass lesion, hemorrhage, hydrocephalus or extra-axial collection. No pituitary mass. No inflammatory sinus disease. No skull or skullbase lesion.  MRA HEAD FINDINGS  Both internal carotid arteries are widely patent into the brain. No siphon stenosis. The anterior and middle cerebral vessels are patent bilaterally without proximal stenosis, aneurysm or vascular malformation.  Both vertebral arteries are widely patent to the basilar. No basilar stenosis. Posterior circulation branch vessels are patent an unremarkable. Large posterior communicating artery noted on the right.  Distal vessels show some atherosclerotic irregularity.  IMPRESSION: 2.5 cm acute infarction affecting the right basal ganglia and corona radiata region. No mass effect  or hemorrhage.  Extensive chronic small vessel ischemic changes elsewhere throughout the brain.  No large or medium vessel occlusion or correctable proximal stenosis. Distal vessel atherosclerotic irregularity.   Electronically Signed   By: Nelson Chimes M.D.   On: 10/05/2014 09:23     PHYSICAL EXAM Frail elderly Caucasian lady not in distress.Awake alert. Afebrile. Head is nontraumatic. Neck is supple without bruit. Hearing is normal. Cardiac exam no murmur or gallop. Lungs are clear to auscultation. Distal pulses are well felt. Neurological Exam : Awake alert oriented x 2.Diminished attention, registration and recall.Follows simple 1 step commands only. normal speech and language. Mild left lower face asymmetry. Tongue midline. Mild LUE drift. Mild diminished fine finger movements on left. Orbits right over left upper extremity. Mild left grip weak.. Normal sensation . Normal coordination. ASSESSMENT/PLAN Alexis Smith is a 80 y.o. female with history of hypothyroidism presenting with left facial droop and left sided weakness. She did not receive IV t-PA due to delay in arrival.   Stroke:  Non-dominant right basal ganglia infarct secondary to small vessel disease    MRI  R BG infarct  MRA  No large vessel stenosis  Carotid Doppler  No significant stenosis   2D Echo  No source of embolus   HgbA1c 6.3  Lovenox 20 mg sq daily for VTE prophylaxis  Dysphagia   Bedrest  no anticoagulants prior to admission, now on aspirin 150 mg suppository  Ongoing aggressive risk factor management  Therapy recommendations:  CIR vs SNF. Will get CIR consult  Disposition:  pending   Hypertension  Permissive hypertension (OK if < 220/120) but gradually normalize in 5-7 days  Hyperlipidemia  LDL 144 goal < 70  Now on lipitor 40 mg daily  Continue statin at discharge  Other Stroke Risk Factors Advanced age ETOH use   Cachectic Body mass index is 15.45 kg/(m^2).    Family hx stroke  (father)  Hospital day # 1  Burnetta Sabin, MSN, RN, ANVP-BC, ANP-BC, [facility] Pager: [phone] 10/05/2014 3:35 PM   I have personally examined this patient, reviewed notes, independently viewed imaging studies, participated in medical decision making and plan of care. I have made any additions or clarifications directly to the above note. Agree with note above.D/W husband and answered questions.  Antony Contras, MD Medical Director [facility] Pager: [phone] 10/05/2014 6:17 PM   To contact Stroke Continuity provider, please refer to [email or web address] After hours, contact General Neurology

## 2014-10-05 NOTE — Progress Notes (Signed)
Patients blood pressure was 216/127 at 0006 and 206/74 at 0200 obtained one time order for 5 mg hydralazine BP 180/88 at 0405 will continue to monitor.

## 2014-10-05 NOTE — Progress Notes (Signed)
UR complete.  Nasia Cannan RN, MSN 

## 2014-10-05 NOTE — Evaluation (Signed)
Clinical/Bedside Swallow Evaluation Patient Details  Name: Alexis Smith MRN: [id number] Date of Birth: 1916/01/25  Today's Date: 10/05/2014 Time: 0910-0930 SLP Time Calculation (min): 20 min  Past Medical History:  Past Medical History  Diagnosis Date  . Blood in stool   . FHx: allergies   . Thyroid condition   . Diverticulitis   . Anemia   . Anxiety   . Depression   . History of orthostatic hypotension   . Restless leg syndrome   . Insomnia   . Osteopenia   . GERD (gastroesophageal reflux disease)   . Hemorrhoidal skin tag   . Hyperlipidemia   . Hypothyroidism   . Diverticulosis   . Hemorrhoids   . Chronic urethritis   . Weight loss   . Peripheral neuropathy   . Cervical cancer 1960  . Arthritis     hands   . Dysphagia   . Hiatal hernia    Past Surgical History:  Past Surgical History  Procedure Laterality Date  . Abdominal hysterectomy  1960  . Hand surgery      bilateral  . Hemorrhoid surgery    . Cataract extraction      Bilaterally  . Ercp  04/16/2012    Procedure: ENDOSCOPIC RETROGRADE CHOLANGIOPANCREATOGRAPHY (ERCP);  Surgeon: Inda Castle, MD;  Location: [facility];  Service: Gastroenterology;  Laterality: N/A;  . Ercp    . Colonoscopy    . Breast lumpectomy      left  . Egd with dilation  06/2012    Dr. Deatra Ina  . Esophagogastroduodenoscopy  07/28/2012    Procedure: ESOPHAGOGASTRODUODENOSCOPY (EGD);  Surgeon: Jerene Bears, MD;  Location: [facility];  Service: Gastroenterology;  Laterality: N/A;  balloon dilation  . Balloon dilation  07/28/2012    Procedure: BALLOON DILATION;  Surgeon: Jerene Bears, MD;  Location: [facility];  Service: Gastroenterology;  Laterality: N/A;   HPI:  Alexis Smith is a 80 y.o. female with prior h/o hypothyroidism,GERD, esophageal dysphagia s/p EGD with balloon dilation x2, and HH. She was brought in 10/06/2014 for worsening left sided weakness and left facial droop that began the day before. CXR was clear and MRI revealed acute  infarction affecting the right basal ganglia and corona radiata region with extensive chronic small vessel ischemic changes elsewhere throughout the brain.   Assessment / Plan / Recommendation Clinical Impression  Pt presents with a moderate oral dysphagia characterized by left-sided weakness as well as decreased awareness and sustained attention to boluses, resulting in prolonged manipulation followed by oral holding. SLP provided Mod-Max cues throughout intake for posterior propulsion and swallow initiation. Pharyngeal swallow appeared to occur with increased automaticity once the bolus was pushed posteriorly, however intermittent coughing was noted if patient was holding large amounts of solids or liquids in her mouth. This is likely due to generalized weakness and decreased control with larger quantities. Recommend to initiate Dys 2 textures and thin liquids with full supervision to monitor for swallow initiation and oral holding.    Aspiration Risk  Moderate    Diet Recommendation Dysphagia 2 (Fine chop);Thin liquid   Liquid Administration via: Cup;Straw Medication Administration: Whole meds with puree Supervision: Patient able to self feed;Full supervision/cueing for compensatory strategies Compensations: Slow rate;Small sips/bites;Check for pocketing Postural Changes and/or Swallow Maneuvers: Seated upright 90 degrees;Upright 30-60 min after meal    Other  Recommendations Oral Care Recommendations: Oral care BID   Follow Up Recommendations  Skilled Nursing facility;24 hour supervision/assistance (pending PT/OT evaluations)  Frequency and Duration min 2x/week  2 weeks   Pertinent Vitals/Pain n/a    SLP Swallow Goals     Swallow Study Prior Functional Status       General Date of Onset: 10/03/14 HPI: Alexis Smith is a 80 y.o. female with prior h/o hypothyroidism,GERD, esophageal dysphagia s/p EGD with balloon dilation x2, and HH. She was brought in 10/11/2014 for worsening  left sided weakness and left facial droop that began the day before. 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 Type of Study: Bedside swallow evaluation Previous Swallow Assessment: none in chart (see HPI for description of esophageal dysphagia) Diet Prior to this Study: NPO Temperature Spikes Noted: No Respiratory Status: Room air History of Recent Intubation: No Behavior/Cognition: Alert;Cooperative;Pleasant mood;Confused;Requires cueing Oral Cavity - Dentition: Dentures, top;Dentures, bottom Self-Feeding Abilities: Able to feed self Patient Positioning: Upright in bed Baseline Vocal Quality: Clear;Low vocal intensity;Other (comment) (intermittently strained) Volitional Cough: Weak    Oral/Motor/Sensory Function Overall Oral Motor/Sensory Function: Impaired Labial ROM: Within Functional Limits Labial Symmetry: Within Functional Limits Labial Strength: Reduced Lingual ROM: Within Functional Limits Lingual Symmetry: Within Functional Limits Lingual Strength: Reduced Facial ROM: Reduced left Facial Symmetry: Left droop Facial Strength: Reduced   Ice Chips Ice chips: Not tested   Thin Liquid Thin Liquid: Impaired Presentation: Cup;Self Fed;Straw Oral Phase Impairments: Poor awareness of bolus Oral Phase Functional Implications: Oral holding Pharyngeal  Phase Impairments: Suspected delayed Swallow;Decreased hyoid-laryngeal movement;Cough - Immediate    Nectar Thick Nectar Thick Liquid: Not tested   Honey Thick Honey Thick Liquid: Not tested   Puree Puree: Impaired Presentation: Self Fed;Spoon Oral Phase Impairments: Poor awareness of bolus Oral Phase Functional Implications: Oral holding Pharyngeal Phase Impairments: Suspected delayed Swallow;Decreased hyoid-laryngeal movement   Solid   GO    Solid: Impaired Presentation: Self  Fed Oral Phase Impairments: Poor awareness of bolus;Impaired mastication Oral Phase Functional Implications: Oral holding Pharyngeal Phase Impairments: Suspected delayed Swallow;Decreased hyoid-laryngeal movement      Germain Osgood, M.A. CCC-SLP [phone]  Germain Osgood 10/05/2014,10:08 AM

## 2014-10-05 NOTE — Progress Notes (Signed)
*  PRELIMINARY RESULTS* Vascular Ultrasound Carotid Duplex (Doppler) has been completed. Findings suggest 1-39% internal carotid artery stenosis bilaterally. Vertebral arteries are patent with antegrade flow.  10/05/2014 2:18 PM Maudry Mayhew, RVT, RDCS, RDMS

## 2014-10-05 NOTE — Consult Note (Signed)
Physical Medicine and Rehabilitation Consult Reason for Consult: CVA Referring Physician: [facility]   HPI: Alexis Smith is a 80 y.o. right-handed female with history of anemia, orthostatic hypotension. By report patient lives with her husband question functional status prior to admission. Admitted 09/30/2014 left side weakness, slurred speech and right gaze preference. MRI of the brain showed a 2.5 cm acute infarction affecting the right basal ganglia and corona radiata. MRA of the head with no major vessel occlusion or stenosis. Echocardiogram with ejection fraction of 65% no cardiac source of embolism and carotid Dopplers with no ICA stenosis. Patient did not receive TPA. Neurology services consulted placed on aspirin for CVA prophylaxis as well as subcutaneous Lovenox for DVT prophylaxis. Currently with a dysphagia 2 thin liquid diet. Physical occupational therapy evaluations completed 10/05/2014. M.D. has requested physical medicine rehabilitation consult.   Review of Systems  Unable to perform ROS: mental acuity   Past Medical History  Diagnosis Date  . Blood in stool   . FHx: allergies   . Thyroid condition   . Diverticulitis   . Anemia   . Anxiety   . Depression   . History of orthostatic hypotension   . Restless leg syndrome   . Insomnia   . Osteopenia   . GERD (gastroesophageal reflux disease)   . Hemorrhoidal skin tag   . Hyperlipidemia   . Hypothyroidism   . Diverticulosis   . Hemorrhoids   . Chronic urethritis   . Weight loss   . Peripheral neuropathy   . Cervical cancer 1960  . Arthritis     hands   . Dysphagia   . Hiatal hernia    Past Surgical History  Procedure Laterality Date  . Abdominal hysterectomy  1960  . Hand surgery      bilateral  . Hemorrhoid surgery    . Cataract extraction      Bilaterally  . Ercp  04/16/2012    Procedure: ENDOSCOPIC RETROGRADE CHOLANGIOPANCREATOGRAPHY (ERCP);  Surgeon: Inda Castle, MD;  Location: [facility];  Service:  Gastroenterology;  Laterality: N/A;  . Ercp    . Colonoscopy    . Breast lumpectomy      left  . Egd with dilation  06/2012    Dr. Deatra Ina  . Esophagogastroduodenoscopy  07/28/2012    Procedure: ESOPHAGOGASTRODUODENOSCOPY (EGD);  Surgeon: Jerene Bears, MD;  Location: [facility];  Service: Gastroenterology;  Laterality: N/A;  balloon dilation  . Balloon dilation  07/28/2012    Procedure: BALLOON DILATION;  Surgeon: Jerene Bears, MD;  Location: [facility];  Service: Gastroenterology;  Laterality: N/A;   Family History  Problem Relation Age of Onset  . Colon cancer Brother   . Stroke Father   . Dementia Sister     x 2  . Diabetes Cousin   . COPD Daughter    Social History:  reports that she has never smoked. She has never used smokeless tobacco. She reports that she drinks alcohol. She reports that she does not use illicit drugs. Allergies:  Allergies  Allergen Reactions  . Codeine Phosphate     REACTION: unspecified  . Tetanus Toxoid Adsorbed     REACTION: unspecified   Medications Prior to Admission  Medication Sig Dispense Refill  . ALPRAZolam (XANAX) 1 MG tablet Take 1 tablet (1 mg total) by mouth 3 (three) times daily as needed for anxiety.  90 tablet  3  . butalbital-acetaminophen-caffeine (FIORICET, ESGIC) 50-325-40 MG per tablet Take 1 tablet  by mouth 3 (three) times daily as needed for headache.      . gabapentin (NEURONTIN) 100 MG capsule Take 100-200 mg by mouth at bedtime as needed (pain).      Marland Kitchen levothyroxine (SYNTHROID, LEVOTHROID) 50 MCG tablet Take 50 mcg by mouth daily before breakfast.      . omeprazole (PRILOSEC) 20 MG capsule Take 20 mg by mouth daily.      . ropinirole (REQUIP) 5 MG tablet Take 5 mg by mouth at bedtime.      . sertraline (ZOLOFT) 100 MG tablet Take 100 mg by mouth 2 (two) times daily.      . [DISCONTINUED] gabapentin (NEURONTIN) 100 MG capsule Take 1-2 capsules (100-200 mg total) by mouth at bedtime as needed.  90 capsule  3    Home: Home  Living  Lives With: Spouse  Functional History:   Functional Status:  Mobility:          ADL:    Cognition: Cognition Overall Cognitive Status: Impaired/Different from baseline Arousal/Alertness: Awake/alert Orientation Level: Disoriented X4 (requires Max cues from SLP) Attention: Sustained Sustained Attention: Impaired Sustained Attention Impairment: Verbal basic;Functional basic Memory: Impaired Memory Impairment: Decreased recall of new information;Storage deficit;Retrieval deficit Awareness: Impaired Awareness Impairment: Emergent impairment;Anticipatory impairment Problem Solving: Impaired Problem Solving Impairment: Functional basic Cognition Overall Cognitive Status: Impaired/Different from baseline  Blood pressure 190/86, pulse 106, temperature 98.2 F (36.8 C), temperature source Oral, resp. rate 18, height 5\' 2"  (1.575 m), weight 38.329 kg (84 lb 8 oz), SpO2 100.00%. Physical Exam  Vitals reviewed. Constitutional:  80 year old frail elderly Caucasian female  Eyes:  Pupils reactive to light  Neck: Normal range of motion. Neck supple. No thyromegaly present.  Cardiovascular: Normal rate and regular rhythm.   Respiratory:  Decreased breath sounds at the bases  GI: Soft. Bowel sounds are normal. She exhibits no distension.  Neurological:  Patient with decreased attention and significant right gaze preference. She is a very poor medical historian. She did follow simple one-step commands.  Skin: Skin is warm and dry.    Results for orders placed during the hospital encounter of 09/30/2014 (from the past 24 hour(s))  URINE RAPID DRUG SCREEN (HOSP PERFORMED)     Status: Abnormal   Collection Time    10/08/2014 11:58 PM      Result Value Ref Range   Opiates POSITIVE (*) NONE DETECTED   Cocaine NONE DETECTED  NONE DETECTED   Benzodiazepines POSITIVE (*) NONE DETECTED   Amphetamines NONE DETECTED  NONE DETECTED   Tetrahydrocannabinol NONE DETECTED  NONE DETECTED     Barbiturates POSITIVE (*) NONE DETECTED  URINALYSIS, ROUTINE W REFLEX MICROSCOPIC     Status: Abnormal   Collection Time    09/30/2014 11:58 PM      Result Value Ref Range   Color, Urine YELLOW  YELLOW   APPearance CLOUDY (*) CLEAR   Specific Gravity, Urine 1.028  1.005 - 1.030   pH 5.5  5.0 - 8.0   Glucose, UA NEGATIVE  NEGATIVE mg/dL   Hgb urine dipstick NEGATIVE  NEGATIVE   Bilirubin Urine SMALL (*) NEGATIVE   Ketones, ur NEGATIVE  NEGATIVE mg/dL   Protein, ur 30 (*) NEGATIVE mg/dL   Urobilinogen, UA 0.2  0.0 - 1.0 mg/dL   Nitrite NEGATIVE  NEGATIVE   Leukocytes, UA NEGATIVE  NEGATIVE  URINE MICROSCOPIC-ADD ON     Status: Abnormal   Collection Time    09/21/2014 11:58 PM      Result Value  Ref Range   Squamous Epithelial / LPF FEW (*) RARE   WBC, UA 0-2  <3 WBC/hpf   Bacteria, UA RARE  RARE   Crystals CA OXALATE CRYSTALS (*) NEGATIVE  LIPID PANEL     Status: Abnormal   Collection Time    10/05/14  4:50 AM      Result Value Ref Range   Cholesterol 248 (*) 0 - 200 mg/dL   Triglycerides 126  <150 mg/dL   HDL 79  >39 mg/dL   Total CHOL/HDL Ratio 3.1     VLDL 25  0 - 40 mg/dL   LDL Cholesterol 144 (*) 0 - 99 mg/dL  HEMOGLOBIN A1C     Status: Abnormal   Collection Time    10/05/14  4:50 AM      Result Value Ref Range   Hemoglobin A1C 6.3 (*) <5.7 %   Mean Plasma Glucose 134 (*) <117 mg/dL  TSH     Status: None   Collection Time    10/05/14  9:15 AM      Result Value Ref Range   TSH 2.190  0.350 - 4.500 uIU/mL   Dg Chest 2 View  10/05/2014   CLINICAL DATA:  Patient is week and short of breath.  Stroke.  EXAM: CHEST  2 VIEW  COMPARISON:  04/25/2014.  FINDINGS: Cardiac silhouette is normal in size and configuration. Aorta is mildly uncoiled. Moderate size hiatal hernia. No mediastinal or hilar masses.  Lungs are mildly hyperexpanded but clear. No pleural effusion or pneumothorax  Bony thorax is demineralized but intact.  IMPRESSION: No acute cardiopulmonary disease.    Electronically Signed   By: Lajean Manes M.D.   On: 10/05/2014 08:32   Ct Head Wo Contrast  09/27/2014   CLINICAL DATA:  Left-sided weakness and aphasia  EXAM: CT HEAD WITHOUT CONTRAST  TECHNIQUE: Contiguous axial images were obtained from the base of the skull through the vertex without intravenous contrast.  COMPARISON:  06/10/2007  FINDINGS: The bony calvarium is intact. Diffuse atrophic changes are again identified as are chronic white matter ischemic changes. No findings to suggest acute hemorrhage, acute infarction or space-occupying mass lesion are identified.  IMPRESSION: Chronic changes without acute abnormality.  These results were called by telephone at the time of interpretation on 09/19/2014 at 12:11 pm to Dr. Armida Sans , who verbally acknowledged these results.   Electronically Signed   By: Inez Catalina M.D.   On: 10/03/2014 12:11   Mr Brain Wo Contrast  10/05/2014   CLINICAL DATA:  Worsening of left-sided weakness. Left facial droop. Onset yesterday. Found on the floor.  EXAM: MRI HEAD WITHOUT CONTRAST  MRA HEAD WITHOUT CONTRAST  TECHNIQUE: Multiplanar, multiecho pulse sequences of the brain and surrounding structures were obtained without intravenous contrast. Angiographic images of the head were obtained using MRA technique without contrast.  COMPARISON:  Head CT 09/29/2014.  MRI 07/19/2007.  FINDINGS: MRI HEAD FINDINGS  There is a 2.5 cm region of acute infarction affecting the right basal ganglia and corona radiata region. No swelling, hemorrhage or mass effect. No other acute infarction. Chronic small-vessel ischemic changes affect the pons. No focal cerebellar insult. There chronic small-vessel ischemic changes affecting the thalami, basal ganglia and throughout the cerebral hemispheric white matter. No cortical or large vessel territory infarction. No mass lesion, hemorrhage, hydrocephalus or extra-axial collection. No pituitary mass. No inflammatory sinus disease. No skull or skullbase  lesion.  MRA HEAD FINDINGS  Both internal carotid arteries are widely patent into  the brain. No siphon stenosis. The anterior and middle cerebral vessels are patent bilaterally without proximal stenosis, aneurysm or vascular malformation.  Both vertebral arteries are widely patent to the basilar. No basilar stenosis. Posterior circulation branch vessels are patent an unremarkable. Large posterior communicating artery noted on the right.  Distal vessels show some atherosclerotic irregularity.  IMPRESSION: 2.5 cm acute infarction affecting the right basal ganglia and corona radiata region. No mass effect or hemorrhage.  Extensive chronic small vessel ischemic changes elsewhere throughout the brain.  No large or medium vessel occlusion or correctable proximal stenosis. Distal vessel atherosclerotic irregularity.   Electronically Signed   By: Nelson Chimes M.D.   On: 10/05/2014 09:23   Mr Jodene Nam Head/brain Wo Cm  10/05/2014   CLINICAL DATA:  Worsening of left-sided weakness. Left facial droop. Onset yesterday. Found on the floor.  EXAM: MRI HEAD WITHOUT CONTRAST  MRA HEAD WITHOUT CONTRAST  TECHNIQUE: Multiplanar, multiecho pulse sequences of the brain and surrounding structures were obtained without intravenous contrast. Angiographic images of the head were obtained using MRA technique without contrast.  COMPARISON:  Head CT 09/27/2014.  MRI 07/19/2007.  FINDINGS: MRI HEAD FINDINGS  There is a 2.5 cm region of acute infarction affecting the right basal ganglia and corona radiata region. No swelling, hemorrhage or mass effect. No other acute infarction. Chronic small-vessel ischemic changes affect the pons. No focal cerebellar insult. There chronic small-vessel ischemic changes affecting the thalami, basal ganglia and throughout the cerebral hemispheric white matter. No cortical or large vessel territory infarction. No mass lesion, hemorrhage, hydrocephalus or extra-axial collection. No pituitary mass. No inflammatory  sinus disease. No skull or skullbase lesion.  MRA HEAD FINDINGS  Both internal carotid arteries are widely patent into the brain. No siphon stenosis. The anterior and middle cerebral vessels are patent bilaterally without proximal stenosis, aneurysm or vascular malformation.  Both vertebral arteries are widely patent to the basilar. No basilar stenosis. Posterior circulation branch vessels are patent an unremarkable. Large posterior communicating artery noted on the right.  Distal vessels show some atherosclerotic irregularity.  IMPRESSION: 2.5 cm acute infarction affecting the right basal ganglia and corona radiata region. No mass effect or hemorrhage.  Extensive chronic small vessel ischemic changes elsewhere throughout the brain.  No large or medium vessel occlusion or correctable proximal stenosis. Distal vessel atherosclerotic irregularity.   Electronically Signed   By: Nelson Chimes M.D.   On: 10/05/2014 09:23    Assessment/Plan: Diagnosis: right bg/cr cva 1. Does the need for close, 24 hr/day medical supervision in concert with the patient's rehab needs make it unreasonable for this patient to be served in a less intensive setting? No 2. Co-Morbidities requiring supervision/potential complications:   3. Due to bladder management, bowel management, safety, skin/wound care, disease management, medication administration and pain management, does the patient require 24 hr/day rehab nursing? No 4. Does the patient require coordinated care of a physician, rehab nurse, PT, OT to address physical and functional deficits in the context of the above medical diagnosis(es)? Potentially Addressing deficits in the following areas: balance, endurance, locomotion, strength, transferring, bowel/bladder control, bathing, dressing, feeding, grooming and toileting 5. Can the patient actively participate in an intensive therapy program of at least 3 hrs of therapy per day at least 5 days per week? No 6. The potential  for patient to make measurable gains while on inpatient rehab is fair and poor 7. Anticipated functional outcomes upon discharge from inpatient rehab are n/a  with PT, n/a with OT,  n/a with SLP. 8. Estimated rehab length of stay to reach the above functional goals is: n/a 9. Does the patient have adequate social supports to accommodate these discharge functional goals? No 10. Anticipated D/C setting: Other 11. Anticipated post D/C treatments: N/A 12. Overall Rehab/Functional Prognosis: fair  RECOMMENDATIONS: This patient's condition is appropriate for continued rehabilitative care in the following setting: SNF Patient has agreed to participate in recommended program. Potentially Note that insurance prior authorization may be required for reimbursement for recommended care.   Meredith Staggers, MD, Mellody Drown     10/05/2014

## 2014-10-05 NOTE — Progress Notes (Signed)
PROGRESS NOTE  Alexis Smith Alexis Smith DOB: 02-27-1916 DOA: 09/23/2014 PCP: Alexis Kehr, MD  HPI: Alexis Smith is a 80 y.o. female with prior h/o hypothyroidism, was brought in for wrsening left sided weakness and left facial droop.  Subjective/ 24 H Interval events - persistent neuro symptoms this morning, MRI positive for CVA  Assessment/Plan: Acute thromboembolic cerebrovascular accident  CVA (cerebral infarction) - MRI positive - LDL 144, HLD 79, cholesterol 248. Start Statin - 2D echo pending, carotids pending - PT consult, SLP consult - continue Aspirin po - stroke team to see Hypothyroidism: Resume synthroid.  Hypokalemia replete as needed.   Diet: dysphagia 2 Fluids: none  DVT Prophylaxis: Lovenox  Code Status: Full Family Communication: d/w husband bedside  Disposition Plan: inpatient  Consultants:  Neurology   Procedures:  None    Antibiotics None   Studies  Filed Vitals:   10/05/14 0005 10/05/14 0205 10/05/14 0405 10/05/14 1009  BP: 216/127 206/74 180/88 190/86  Pulse: 98 84 108 106  Temp: 98.4 F (36.9 C) 98.1 F (36.7 C) 98.2 F (36.8 C) 98.2 F (36.8 C)  TempSrc: Oral Oral Oral Oral  Resp: 16 15 16 18   Height:      Weight:      SpO2: 98% 98% 97% 100%    Intake/Output Summary (Last 24 hours) at 10/05/14 1156 Last data filed at 10/05/14 0157  Gross per 24 hour  Intake      0 ml  Output    225 ml  Net   -225 ml   Filed Weights   10/10/2014 1445  Weight: 38.329 kg (84 lb 8 oz)   Exam:  General:  NAD  Cardiovascular: RRR  Respiratory: CTA biL  Abdomen: soft, non tender  MSK: no edema  Neuro: left facial droop, left sided weakness, dysarthric   Data Reviewed: Basic Metabolic Panel:  Recent Labs Lab 09/24/2014 1144 10/09/2014 1148  NA 140 138  K 3.7 3.5*  CL 100 101  CO2 27  --   GLUCOSE 95 95  BUN 21 21  CREATININE 0.91 1.00  CALCIUM 9.1  --    Liver Function Tests:  Recent Labs Lab 09/19/2014 1144  AST  15  ALT 6  ALKPHOS 136*  BILITOT <0.2*  PROT 7.0  ALBUMIN 3.5   CBC:  Recent Labs Lab 10/10/2014 1144 09/23/2014 1148  WBC 8.8  --   NEUTROABS 7.4  --   HGB 11.5* 12.9  HCT 35.3* 38.0  MCV 89.1  --   PLT 262  --    CBG:  Recent Labs Lab 10/06/2014 1211  GLUCAP 107*   Studies: Ct Head Wo Contrast 10/03/2014 Chronic changes without acute abnormality.  These results were called by telephone at the time of interpretation on 09/16/2014 at 12:11 pm to Dr. Armida Smith , who verbally acknowledged these results.  MRI brain 10/21 2.5 cm acute infarction affecting the right basal ganglia and corona radiata region. No mass effect or hemorrhage.  Extensive chronic small vessel ischemic changes elsewhere throughout the brain. No large or medium vessel occlusion or correctable proximal stenosis. Distal vessel atherosclerotic irregularity.   Scheduled Meds: .  stroke: mapping our early stages of recovery book   Does not apply Once  . aspirin  81 mg Oral Daily  . enoxaparin (LOVENOX) injection  20 mg Subcutaneous Q24H  . levothyroxine  25 mcg Intravenous QAC breakfast   Continuous Infusions: . sodium chloride 50 mL/hr at 10/03/2014 1814    Active Problems:  Acute thromboembolic cerebrovascular accident   CVA (cerebral infarction)   Time spent: [place], MD [facility] Pager [phone]. If 7 PM - 7 AM, please contact night-coverage at [email or web address], password [facility] 10/05/2014, 11:56 AM  LOS: 1 day

## 2014-10-05 NOTE — Evaluation (Signed)
Physical Therapy Evaluation Patient Details Name: Alexis Smith MRN: [id number] DOB: [birth year] Today's Date: 10/05/2014   History of Present Illness  80 y.o. female admitted to [facility] on 09/22/2014 with left sided weakness and left facial droop.  MRI revealed acute infarction affecting the right basal ganglia and corona radiata.  Pt with significant PMHx of anemia, anxiety/depression, orthostatic hypotension, insomnia, peripheral neuropathy, dysphasia, and EGD with dialation (2013).    Clinical Impression  Pt was able to mobilize with PT at a mod assist level, short distance gait with RW OOB to the recliner chair with mod assist.  I do not believe she could handle the intensity of inpatient rehab and it is for this reason and the fact that her elderly husband cannot physically care for her that I am recommending SNF level rehab.  PT to follow acutely for deficits listed below.       Follow Up Recommendations SNF    Equipment Recommendations  Rolling walker with 5" wheels    Recommendations for Other Services   NA    Precautions / Restrictions Precautions Precautions: Fall Precaution Comments: pt is very weak and unsteady on her feet.       Mobility  Bed Mobility Overal bed mobility: Needs Assistance Bed Mobility: Supine to Sit     Supine to sit: Mod assist     General bed mobility comments: Mod assist to support trunk and provide leverage for bil hand held assist to get to sitting.  Pt pulling against therapist.   Transfers Overall transfer level: Needs assistance Equipment used: Rolling walker (2 wheeled) Transfers: Sit to/from Stand Sit to Stand: +2 safety/equipment;Mod assist         General transfer comment: Two person assist used only for safety, did not end up needed second person assist.  Mod assist to support trunk over weak and quivering legs.   Ambulation/Gait Ambulation/Gait assistance: +2 safety/equipment;Mod assist Ambulation Distance (Feet): 10  Feet Assistive device: Rolling walker (2 wheeled) Gait Pattern/deviations: Step-through pattern;Shuffle;Leaning posteriorly Gait velocity: decreased Gait velocity interpretation: <1.8 ft/sec, indicative of risk for recurrent falls General Gait Details: Pt with posterior lean and seemed to be pushing away from RW instead of using it for support.  Pt needed mod assist to support her trunk in standing and with gait and had significant difficulty maintaining her balance as she backed up to sit in the recliner chair.       Modified Rankin (Stroke Patients Only) Modified Rankin (Stroke Patients Only) Pre-Morbid Rankin Score:  (unknown) Modified Rankin: Moderately severe disability     Balance Overall balance assessment: Needs assistance Sitting-balance support: Feet supported;No upper extremity supported Sitting balance-Leahy Scale: Fair Sitting balance - Comments: supervision seated EOB once feet supported on the ground Postural control: Posterior lean;Other (comment) (in standing) Standing balance support: Bilateral upper extremity supported Standing balance-Leahy Scale: Poor Standing balance comment: Pt was unable to stand without external support.                              Pertinent Vitals/Pain Pain Assessment: No/denies pain    Home Living Family/patient expects to be discharged to:: Skilled nursing facility                               Extremity/Trunk Assessment   Upper Extremity Assessment: Defer to OT evaluation  Lower Extremity Assessment: Generalized weakness (no obvious asymmetry noted, but difficult to MMT. )      Cervical / Trunk Assessment: Kyphotic  Communication   Communication: Expressive difficulties;HOH (expressive difficulties, and question HOH)  Cognition Arousal/Alertness: Awake/alert Behavior During Therapy: WFL for tasks assessed/performed Overall Cognitive Status: Impaired/Different from baseline Area of  Impairment: Attention;Memory;Following commands;Safety/judgement;Awareness;Problem solving;Orientation Orientation Level: Disoriented to;Time (unable to tell me the year, but was able to say "October") Current Attention Level: Sustained   Following Commands: Follows one step commands with increased time     Problem Solving: Slow processing;Requires verbal cues;Requires tactile cues      General Comments General comments (skin integrity, edema, etc.): Pt is very thin.  You can see her ribs and scapula under her skin.  Chair padded with pillows for increased cushion on her sacrum.           Assessment/Plan    PT Assessment Patient needs continued PT services  PT Diagnosis Difficulty walking;Abnormality of gait;Generalized weakness;Altered mental status   PT Problem List Decreased strength;Decreased activity tolerance;Decreased balance;Decreased mobility;Decreased cognition;Decreased knowledge of use of DME;Decreased safety awareness;Decreased knowledge of precautions  PT Treatment Interventions DME instruction;Gait training;Functional mobility training;Therapeutic activities;Balance training;Therapeutic exercise;Cognitive remediation;Neuromuscular re-education;Patient/family education   PT Goals (Current goals can be found in the Care Plan section) Acute Rehab PT Goals Patient Stated Goal: unable to state PT Goal Formulation: Patient unable to participate in goal setting Time For Goal Achievement: 10/19/14 Potential to Achieve Goals: Good    Frequency Min 3X/week   Barriers to discharge Decreased caregiver support elderly husband cannot physically care for her.        End of Session Equipment Utilized During Treatment: Gait belt Activity Tolerance: Patient tolerated treatment well Patient left: in chair;with call bell/phone within reach;with chair alarm set           Time: 1425-1445 PT Time Calculation (min): 20 min   Charges:   PT Evaluation $Initial PT Evaluation  Tier I: 1 Procedure PT Treatments $Therapeutic Activity: 8-22 mins        Jona Zappone B. [place], [place], DPT [phone]   10/05/2014, 4:05 PM

## 2014-10-06 ENCOUNTER — Inpatient Hospital Stay (HOSPITAL_COMMUNITY): Payer: Medicare PPO

## 2014-10-06 LAB — BASIC METABOLIC PANEL
Anion gap: 17 — ABNORMAL HIGH (ref 5–15)
BUN: 13 mg/dL (ref 6–23)
CO2: 24 mEq/L (ref 19–32)
Calcium: 10.2 mg/dL (ref 8.4–10.5)
Chloride: 98 mEq/L (ref 96–112)
Creatinine, Ser: 0.59 mg/dL (ref 0.50–1.10)
GFR calc Af Amer: 87 mL/min — ABNORMAL LOW (ref >90)
GFR calc non Af Amer: 75 mL/min — ABNORMAL LOW (ref >90)
Glucose, Bld: 145 mg/dL — ABNORMAL HIGH (ref 70–99)
Potassium: 3.1 mEq/L — ABNORMAL LOW (ref 3.7–5.3)
Sodium: 139 mEq/L (ref 137–147)

## 2014-10-06 LAB — CBC
HCT: 38.1 % (ref 36.0–46.0)
Hemoglobin: 12.6 g/dL (ref 12.0–15.0)
MCH: 28.8 pg (ref 26.0–34.0)
MCHC: 33.1 g/dL (ref 30.0–36.0)
MCV: 87 fL (ref 78.0–100.0)
Platelets: 308 10*3/uL (ref 150–400)
RBC: 4.38 MIL/uL (ref 3.87–5.11)
RDW: 13.6 % (ref 11.5–15.5)
WBC: 14.7 10*3/uL — ABNORMAL HIGH (ref 4.0–10.5)

## 2014-10-06 MED ORDER — HYDRALAZINE HCL 20 MG/ML IJ SOLN
10.0000 mg | Freq: Four times a day (QID) | INTRAMUSCULAR | Status: DC | PRN
  Administered 2014-10-06 (×2): 10 mg via INTRAVENOUS
  Filled 2014-10-06 (×2): qty 1

## 2014-10-06 MED ORDER — AMLODIPINE BESYLATE 5 MG PO TABS
5.0000 mg | ORAL_TABLET | Freq: Every day | ORAL | Status: DC
  Administered 2014-10-06: 5 mg via ORAL
  Filled 2014-10-06: qty 1

## 2014-10-06 MED ORDER — RESOURCE THICKENUP CLEAR PO POWD
ORAL | Status: DC | PRN
  Filled 2014-10-06: qty 125

## 2014-10-06 MED ORDER — AMLODIPINE BESYLATE 10 MG PO TABS: 10.0000 mg | ORAL_TABLET | Freq: Every day | ORAL | Status: DC

## 2014-10-06 MED ORDER — POTASSIUM CHLORIDE CRYS ER 20 MEQ PO TBCR
40.0000 meq | EXTENDED_RELEASE_TABLET | Freq: Once | ORAL | Status: AC
  Administered 2014-10-06: 40 meq via ORAL
  Filled 2014-10-06: qty 2

## 2014-10-06 MED ORDER — LEVOTHYROXINE SODIUM 50 MCG PO TABS
50.0000 ug | ORAL_TABLET | Freq: Every day | ORAL | Status: DC
  Administered 2014-10-07: 50 ug via ORAL
  Filled 2014-10-06: qty 1

## 2014-10-06 NOTE — Procedures (Addendum)
Objective Swallowing Evaluation: Modified Barium Swallowing Study  Patient Details  Name: MIHIKA SURRETTE MRN: [id number] Date of Birth: [birth year]  Today's Date: 10/06/2014 Time: 3500-9381 SLP Time Calculation (min): 31 min  Past Medical History:  Past Medical History  Diagnosis Date  . Blood in stool   . FHx: allergies   . Thyroid condition   . Diverticulitis   . Anemia   . Anxiety   . Depression   . History of orthostatic hypotension   . Restless leg syndrome   . Insomnia   . Osteopenia   . GERD (gastroesophageal reflux disease)   . Hemorrhoidal skin tag   . Hyperlipidemia   . Hypothyroidism   . Diverticulosis   . Hemorrhoids   . Chronic urethritis   . Weight loss   . Peripheral neuropathy   . Cervical cancer 1960  . Arthritis     hands   . Dysphagia   . Hiatal hernia    Past Surgical History:  Past Surgical History  Procedure Laterality Date  . Abdominal hysterectomy  1960  . Hand surgery      bilateral  . Hemorrhoid surgery    . Cataract extraction      Bilaterally  . Ercp  04/16/2012    Procedure: ENDOSCOPIC RETROGRADE CHOLANGIOPANCREATOGRAPHY (ERCP);  Surgeon: Inda Castle, MD;  Location: [facility];  Service: Gastroenterology;  Laterality: N/A;  . Ercp    . Colonoscopy    . Breast lumpectomy      left  . Egd with dilation  06/2012    Dr. Deatra Ina  . Esophagogastroduodenoscopy  07/28/2012    Procedure: ESOPHAGOGASTRODUODENOSCOPY (EGD);  Surgeon: Jerene Bears, MD;  Location: [facility];  Service: Gastroenterology;  Laterality: N/A;  balloon dilation  . Balloon dilation  07/28/2012    Procedure: BALLOON DILATION;  Surgeon: Jerene Bears, MD;  Location: [facility];  Service: Gastroenterology;  Laterality: N/A;   HPI:  Alysandra P Okun is a 80 y.o. female with prior h/o hypothyroidism,GERD, esophageal dysphagia s/p EGD with balloon dilation x2, and HH. She was brought in 10/09/2014 for worsening left sided weakness and left facial droop that began the day before. 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     Assessment / Plan / Recommendation Clinical Impression  Dysphagia Diagnosis: Moderate pharyngeal phase dysphagia;Severe oral phase dysphagia Clinical impression: Pt demonstrates a severe oral stage and moderate pharyngeal phase dysphagia. Her oral stage is characterized by difficulty manipulating, forming, and transiting the bolus likely due to buccal and labial weakenss leading to severe oral residue. Piecemeal swallowing and oral residue results in spillage to pharynx post swallow with no swallow trigger despite maximal verbal cues. A dry spoon and throat touch are inconsistently productive in eliciting a swallow. Thin liquids are silently aspirated requiring verbal cueing for cough to clear. Pt's cough is very weak and is not effective in clearing aspirate.The pt is cognitively unable to follow commands for compensatory strategies and shows little awarenes of her deficits. Trials of solids were transited much more quickly and oral residue was reduced. Recommend pt begin a puree only diet with no liquids. SLP will follow up with trials of liquids as appropriate. Pt's husband was informed of results and recommendations. Expect that return to liquid intake with known aspiration risk may be necessary as pt is likely not appropriate for alternate means of nutrition.  Treatment Recommendation  Therapy as outlined in treatment plan below    Diet Recommendation No liquids;Dysphagia 1 (Puree)   Medication Administration: Whole meds with puree Supervision: Staff to assist with self feeding Compensations: Slow rate;Small sips/bites;Multiple dry swallows after each bite/sip Postural Changes and/or Swallow Maneuvers: Seated upright 90 degrees;Upright 30-60 min after meal    Other  Recommendations Oral Care  Recommendations: Oral care BID   Follow Up Recommendations  Skilled Nursing facility;24 hour supervision/assistance    Frequency and Duration min 2x/week  2 weeks   Pertinent Vitals/Pain none    SLP Swallow Goals     General Date of Onset: 10/03/14 HPI: PA TENNANT is a 80 y.o. female with prior h/o hypothyroidism,GERD, esophageal dysphagia s/p EGD with balloon dilation x2, and HH. She was brought in 10/13/2014 for worsening left sided weakness and left facial droop that began the day before. 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 Type of Study: Modified Barium Swallowing Study Reason for Referral: Objectively evaluate swallowing function Previous Swallow Assessment: none in chart (see HPI for description of esophageal dysphagia) Diet Prior to this Study: Dysphagia 2 (chopped);Thin liquids Temperature Spikes Noted: No Respiratory Status: Room air History of Recent Intubation: No Behavior/Cognition: Alert;Cooperative;Requires cueing;Doesn't follow directions Oral Cavity - Dentition: Edentulous Oral Motor / Sensory Function: Impaired motor;Impaired sensory Oral impairment: Left facial;Left labial Self-Feeding Abilities: Needs assist Patient Positioning: Upright in chair Baseline Vocal Quality: Clear;Low vocal intensity;Other (comment) Volitional Cough: Weak Volitional Swallow: Unable to elicit Anatomy: Within functional limits    Reason for Referral Objectively evaluate swallowing function   Oral Phase Oral Preparation/Oral Phase Oral Phase: Impaired Oral - Nectar Oral - Nectar Teaspoon: Impaired mastication;Weak lingual manipulation;Lingual pumping;Reduced posterior propulsion;Holding of bolus;Piecemeal swallowing;Delayed oral transit Oral - Thin Oral - Thin Teaspoon: Impaired mastication;Weak lingual manipulation;Lingual  pumping;Reduced posterior propulsion;Holding of bolus;Piecemeal swallowing;Delayed oral transit Oral - Solids Oral - Puree: Impaired mastication;Weak lingual manipulation;Lingual pumping;Reduced posterior propulsion;Holding of bolus;Piecemeal swallowing;Delayed oral transit   Pharyngeal Phase Pharyngeal Phase Pharyngeal Phase: Impaired Pharyngeal - Nectar Pharyngeal - Nectar Teaspoon: Delayed swallow initiation;Premature spillage to pyriform sinuses Pharyngeal - Thin Pharyngeal - Thin Teaspoon: Delayed swallow initiation;Premature spillage to pyriform sinuses;Penetration/Aspiration during swallow;Moderate aspiration Penetration/Aspiration details (thin teaspoon): Material enters airway, passes BELOW cords without attempt by patient to eject out (silent aspiration) Pharyngeal - Solids Pharyngeal - Puree: Delayed swallow initiation;Premature spillage to pyriform sinuses  Cervical Esophageal Phase    GO    Cervical Esophageal Phase Cervical Esophageal Phase: Delfina Redwood 10/06/2014, 2:37 PM

## 2014-10-06 NOTE — Progress Notes (Addendum)
Patient is sustaining HR over 100 and SBP over 200 obtained one time order for 10 mg of hydralazine will continue to monitor. Lab results 10/22 shows K of 3.1 will alert day nurse in report.

## 2014-10-06 NOTE — Progress Notes (Signed)
Speech Language Pathology Treatment: Dysphagia  Patient Details Name: Alexis Smith MRN: [id number] DOB: May 14, 1916 Today's Date: 10/06/2014 Time: 1100-1120 SLP Time Calculation (min): 20 min  Assessment / Plan / Recommendation Clinical Impression  Pt presents with significant concern for aspiration under skilled observation. Upon arrival RN removing pocketing breakfast from right cheek, SLP removed even more upon closer inspection. Wet vocal quality and congested respiration at baseline. Husband and staff report coughing with meals. SLP observed weak laryngeal elevation, delayed swallow and immediate cough with a small sip of water. Recommend pt remain NPO with MBS today to determine safety with PO.    HPI HPI: Alexis Smith is a 80 y.o. female with prior h/o hypothyroidism,GERD, esophageal dysphagia s/p EGD with balloon dilation x2, and HH. She was brought in 09/17/2014 for worsening left sided weakness and left facial droop that began the day before. 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   Pertinent Vitals    SLP Plan  Continue with current plan of care    Recommendations Diet recommendations: NPO              Follow up Recommendations: Skilled Nursing facility;24 hour supervision/assistance Plan: Continue with current plan of care    GO    [facility], [place] CCC-SLP [phone]  Lynann Beaver 10/06/2014, 12:25 PM

## 2014-10-06 NOTE — Procedures (Signed)
Supervised and reviewed by Kaliya Shreiner MA CCC-SLP  

## 2014-10-06 NOTE — Progress Notes (Signed)
STROKE TEAM PROGRESS NOTE   HISTORY Alexis Smith is an 80 y.o. female brought to ED after family member noted she had a left facial droop and left sided weakness. Patient is unable to give good history but family states they noticed a left facial droop and decreased vision last night 10/03/2014 which did not resolve. Apparently she was left alone this AM 09/21/2014 and when husband returned found her on the floor with left sided weakness. The left side weakness has resolved. Currently patient shows a left facial droop, left hemianopia, dysarthria and right gaze preference. Patient was not a tPA candidate due to unknown LSN. She was admitted for further evaluation and treatment.   SUBJECTIVE (INTERVAL HISTORY) Her husband is at the bedside.  Overall she feels her condition is stable.MRI confirms right basal ganglia infarct.  OBJECTIVE Temp:  [97.5 F (36.4 C)-98.7 F (37.1 C)] 97.5 F (36.4 C) (10/22 0947) Pulse Rate:  [101-141] 133 (10/22 1300) Cardiac Rhythm:  [-] Sinus tachycardia (10/21 1930) Resp:  [16-20] 18 (10/22 1300) BP: (118-210)/(62-95) 125/77 mmHg (10/22 1300) SpO2:  [91 %-96 %] 92 % (10/22 1300)   Recent Labs Lab 09/21/2014 1211  GLUCAP 107*    Recent Labs Lab 09/20/2014 1144 10/14/2014 1148 10/06/14 0535  NA 140 138 139  K 3.7 3.5* 3.1*  CL 100 101 98  CO2 27  --  24  GLUCOSE 95 95 145*  BUN 21 21 13   CREATININE 0.91 1.00 0.59  CALCIUM 9.1  --  10.2    Recent Labs Lab 10/01/2014 1144  AST 15  ALT 6  ALKPHOS 136*  BILITOT <0.2*  PROT 7.0  ALBUMIN 3.5    Recent Labs Lab 09/28/2014 1144 09/15/2014 1148 10/06/14 0535  WBC 8.8  --  14.7*  NEUTROABS 7.4  --   --   HGB 11.5* 12.9 12.6  HCT 35.3* 38.0 38.1  MCV 89.1  --  87.0  PLT 262  --  308   No results found for this basename: CKTOTAL, CKMB, CKMBINDEX, TROPONINI,  in the last 168 hours  Recent Labs  10/06/2014 1144  LABPROT 13.8  INR 1.05    Recent Labs  10/08/2014 2358  COLORURINE YELLOW   LABSPEC 1.028  PHURINE 5.5  GLUCOSEU NEGATIVE  HGBUR NEGATIVE  BILIRUBINUR SMALL*  KETONESUR NEGATIVE  PROTEINUR 30*  UROBILINOGEN 0.2  NITRITE NEGATIVE  LEUKOCYTESUR NEGATIVE       Component Value Date/Time   CHOL 248* 10/05/2014 0450   TRIG 126 10/05/2014 0450   HDL 79 10/05/2014 0450   CHOLHDL 3.1 10/05/2014 0450   VLDL 25 10/05/2014 0450   LDLCALC 144* 10/05/2014 0450   Lab Results  Component Value Date   HGBA1C 6.3* 10/05/2014      Component Value Date/Time   LABOPIA POSITIVE* 10/06/2014 2358   COCAINSCRNUR NONE DETECTED 10/12/2014 2358   LABBENZ POSITIVE* 09/27/2014 2358   AMPHETMU NONE DETECTED 09/15/2014 2358   THCU NONE DETECTED 10/08/2014 2358   LABBARB POSITIVE* 10/13/2014 2358     Recent Labs Lab 09/27/2014 1144  ETH <11    Dg Chest 2 View  10/05/2014   CLINICAL DATA:  Patient is week and short of breath.  Stroke.  EXAM: CHEST  2 VIEW  COMPARISON:  04/25/2014.  FINDINGS: Cardiac silhouette is normal in size and configuration. Aorta is mildly uncoiled. Moderate size hiatal hernia. No mediastinal or hilar masses.  Lungs are mildly hyperexpanded but clear. No pleural effusion or pneumothorax  Bony thorax is demineralized  but intact.  IMPRESSION: No acute cardiopulmonary disease.   Electronically Signed   By: Lajean Manes M.D.   On: 10/05/2014 08:32   Mr Brain Wo Contrast  10/05/2014   CLINICAL DATA:  Worsening of left-sided weakness. Left facial droop. Onset yesterday. Found on the floor.  EXAM: MRI HEAD WITHOUT CONTRAST  MRA HEAD WITHOUT CONTRAST  TECHNIQUE: Multiplanar, multiecho pulse sequences of the brain and surrounding structures were obtained without intravenous contrast. Angiographic images of the head were obtained using MRA technique without contrast.  COMPARISON:  Head CT 09/26/2014.  MRI 07/19/2007.  FINDINGS: MRI HEAD FINDINGS  There is a 2.5 cm region of acute infarction affecting the right basal ganglia and corona radiata region. No swelling,  hemorrhage or mass effect. No other acute infarction. Chronic small-vessel ischemic changes affect the pons. No focal cerebellar insult. There chronic small-vessel ischemic changes affecting the thalami, basal ganglia and throughout the cerebral hemispheric white matter. No cortical or large vessel territory infarction. No mass lesion, hemorrhage, hydrocephalus or extra-axial collection. No pituitary mass. No inflammatory sinus disease. No skull or skullbase lesion.  MRA HEAD FINDINGS  Both internal carotid arteries are widely patent into the brain. No siphon stenosis. The anterior and middle cerebral vessels are patent bilaterally without proximal stenosis, aneurysm or vascular malformation.  Both vertebral arteries are widely patent to the basilar. No basilar stenosis. Posterior circulation branch vessels are patent an unremarkable. Large posterior communicating artery noted on the right.  Distal vessels show some atherosclerotic irregularity.  IMPRESSION: 2.5 cm acute infarction affecting the right basal ganglia and corona radiata region. No mass effect or hemorrhage.  Extensive chronic small vessel ischemic changes elsewhere throughout the brain.  No large or medium vessel occlusion or correctable proximal stenosis. Distal vessel atherosclerotic irregularity.   Electronically Signed   By: Nelson Chimes M.D.   On: 10/05/2014 09:23   Dg Swallowing Func-speech Pathology  10/06/2014   Eden Emms, [place]     10/06/2014  2:39 PM Objective Swallowing Evaluation: Modified Barium Swallowing Study   Patient Details  Name: Alexis Smith MRN: [id number] Date of Birth: [birth year]  Today's Date: 10/06/2014 Time: 1448-1856 SLP Time Calculation (min): 31 min  Past Medical History:  Past Medical History  Diagnosis Date  . Blood in stool   . FHx: allergies   . Thyroid condition   . Diverticulitis   . Anemia   . Anxiety   . Depression   . History of orthostatic hypotension   . Restless leg syndrome   . Insomnia   .  Osteopenia   . GERD (gastroesophageal reflux disease)   . Hemorrhoidal skin tag   . Hyperlipidemia   . Hypothyroidism   . Diverticulosis   . Hemorrhoids   . Chronic urethritis   . Weight loss   . Peripheral neuropathy   . Cervical cancer 1960  . Arthritis     hands   . Dysphagia   . Hiatal hernia    Past Surgical History:  Past Surgical History  Procedure Laterality Date  . Abdominal hysterectomy  1960  . Hand surgery      bilateral  . Hemorrhoid surgery    . Cataract extraction      Bilaterally  . Ercp  04/16/2012    Procedure: ENDOSCOPIC RETROGRADE CHOLANGIOPANCREATOGRAPHY  (ERCP);  Surgeon: Inda Castle, MD;  Location: [facility];   Service: Gastroenterology;  Laterality: N/A;  . Ercp    . Colonoscopy    . Breast lumpectomy  left  . Egd with dilation  06/2012    Dr. Deatra Ina  . Esophagogastroduodenoscopy  07/28/2012    Procedure: ESOPHAGOGASTRODUODENOSCOPY (EGD);  Surgeon: Jerene Bears, MD;  Location: [facility];  Service: Gastroenterology;   Laterality: N/A;  balloon dilation  . Balloon dilation  07/28/2012    Procedure: BALLOON DILATION;  Surgeon: Jerene Bears, MD;   Location: [facility];  Service: Gastroenterology;  Laterality:  N/A;   HPI:  Rosabel P Carnero is a 80 y.o. female with prior h/o  hypothyroidism,GERD, esophageal dysphagia s/p EGD with balloon  dilation x2, and HH. She was brought in 10/03/2014 for worsening  left sided weakness and left facial droop that began the day  before. CXR was clear and MRI revealed acute infarction affecting  the right basal ganglia and corona radiata region with extensive  chronic small vessel ischemic changes elsewhere throughout the  brain.     Assessment / Plan / Recommendation Clinical Impression  Dysphagia Diagnosis: Moderate pharyngeal phase dysphagia;Severe  oral phase dysphagia Clinical impression: Pt demonstrates a severe oral stage and  moderate pharyngeal phase dysphagia. Her oral stage is  characterized by difficulty manipulating, forming, and transiting  the  bolus likely due to buccal and labial weakenss leading to  severe oral residue. Piecemeal swallowing and oral residue  results in spillage to pharynx post swallow with no swallow  trigger despite maximal verbal cues. A dry spoon and throat touch  are inconsistently productive in eliciting a swallow. Thin  liquids are silently aspirated requiring verbal cueing for cough  to clear. Pt's cough is very weak and is not effective in  clearing aspirate.The pt is cognitively unable to follow commands  for compensatory strategies and shows little awarenes of her  deficits. Trials of solids were transited much more quickly and  oral residue was reduced. Recommend pt begin a puree only diet  with no liquids. SLP will follow up with trials of liquids as  appropriate. Pt's husband was informed of results and  recommendations. Expect that return to liquid intake with known  aspiration risk may be necessary as pt is likely not appropriate  for alternate means of nutrition.    Treatment Recommendation  Therapy as outlined in treatment plan below    Diet Recommendation No liquids;Dysphagia 1 (Puree)   Medication Administration: Whole meds with puree Supervision: Staff to assist with self feeding Compensations: Slow rate;Small sips/bites;Multiple dry swallows  after each bite/sip Postural Changes and/or Swallow Maneuvers: Seated upright 90  degrees;Upright 30-60 min after meal    Other  Recommendations Oral Care Recommendations: Oral care BID   Follow Up Recommendations  Skilled Nursing facility;24 hour supervision/assistance    Frequency and Duration min 2x/week  2 weeks   Pertinent Vitals/Pain none    SLP Swallow Goals     General Date of Onset: 10/03/14 HPI: TORRANCE FRECH is a 80 y.o. female with prior h/o  hypothyroidism,GERD, esophageal dysphagia s/p EGD with balloon  dilation x2, and HH. She was brought in 10/03/2014 for worsening  left sided weakness and left facial droop that began the day  before. CXR was clear and MRI revealed  acute infarction affecting  the right basal ganglia and corona radiata region with extensive  chronic small vessel ischemic changes elsewhere throughout the  brain. Type of Study: Modified Barium Swallowing Study Reason for Referral: Objectively evaluate swallowing function Previous Swallow Assessment: none in chart (see HPI for  description of esophageal dysphagia) Diet Prior to this Study: Dysphagia  2 (chopped);Thin liquids Temperature Spikes Noted: No Respiratory Status: Room air History of Recent Intubation: No Behavior/Cognition: Alert;Cooperative;Requires cueing;Doesn't  follow directions Oral Cavity - Dentition: Edentulous Oral Motor / Sensory Function: Impaired motor;Impaired sensory Oral impairment: Left facial;Left labial Self-Feeding Abilities: Needs assist Patient Positioning: Upright in chair Baseline Vocal Quality: Clear;Low vocal intensity;Other (comment) Volitional Cough: Weak Volitional Swallow: Unable to elicit Anatomy: Within functional limits    Reason for Referral Objectively evaluate swallowing function   Oral Phase Oral Preparation/Oral Phase Oral Phase: Impaired Oral - Nectar Oral - Nectar Teaspoon: Impaired mastication;Weak lingual  manipulation;Lingual pumping;Reduced posterior propulsion;Holding  of bolus;Piecemeal swallowing;Delayed oral transit Oral - Thin Oral - Thin Teaspoon: Impaired mastication;Weak lingual  manipulation;Lingual pumping;Reduced posterior propulsion;Holding  of bolus;Piecemeal swallowing;Delayed oral transit Oral - Solids Oral - Puree: Impaired mastication;Weak lingual  manipulation;Lingual pumping;Reduced posterior propulsion;Holding  of bolus;Piecemeal swallowing;Delayed oral transit   Pharyngeal Phase Pharyngeal Phase Pharyngeal Phase: Impaired Pharyngeal - Nectar Pharyngeal - Nectar Teaspoon: Delayed swallow  initiation;Premature spillage to pyriform sinuses Pharyngeal - Thin Pharyngeal - Thin Teaspoon: Delayed swallow initiation;Premature  spillage to pyriform  sinuses;Penetration/Aspiration during  swallow;Moderate aspiration Penetration/Aspiration details (thin teaspoon): Material enters  airway, passes BELOW cords without attempt by patient to eject  out (silent aspiration) Pharyngeal - Solids Pharyngeal - Puree: Delayed swallow initiation;Premature spillage  to pyriform sinuses  Cervical Esophageal Phase    GO    Cervical Esophageal Phase Cervical Esophageal Phase: Delfina Redwood 10/06/2014, 2:37 PM    Mr Jodene Nam Head/brain Wo Cm  10/05/2014   CLINICAL DATA:  Worsening of left-sided weakness. Left facial droop. Onset yesterday. Found on the floor.  EXAM: MRI HEAD WITHOUT CONTRAST  MRA HEAD WITHOUT CONTRAST  TECHNIQUE: Multiplanar, multiecho pulse sequences of the brain and surrounding structures were obtained without intravenous contrast. Angiographic images of the head were obtained using MRA technique without contrast.  COMPARISON:  Head CT 09/15/2014.  MRI 07/19/2007.  FINDINGS: MRI HEAD FINDINGS  There is a 2.5 cm region of acute infarction affecting the right basal ganglia and corona radiata region. No swelling, hemorrhage or mass effect. No other acute infarction. Chronic small-vessel ischemic changes affect the pons. No focal cerebellar insult. There chronic small-vessel ischemic changes affecting the thalami, basal ganglia and throughout the cerebral hemispheric white matter. No cortical or large vessel territory infarction. No mass lesion, hemorrhage, hydrocephalus or extra-axial collection. No pituitary mass. No inflammatory sinus disease. No skull or skullbase lesion.  MRA HEAD FINDINGS  Both internal carotid arteries are widely patent into the brain. No siphon stenosis. The anterior and middle cerebral vessels are patent bilaterally without proximal stenosis, aneurysm or vascular malformation.  Both vertebral arteries are widely patent to the basilar. No basilar stenosis. Posterior circulation branch vessels are patent an unremarkable. Large  posterior communicating artery noted on the right.  Distal vessels show some atherosclerotic irregularity.  IMPRESSION: 2.5 cm acute infarction affecting the right basal ganglia and corona radiata region. No mass effect or hemorrhage.  Extensive chronic small vessel ischemic changes elsewhere throughout the brain.  No large or medium vessel occlusion or correctable proximal stenosis. Distal vessel atherosclerotic irregularity.   Electronically Signed   By: Nelson Chimes M.D.   On: 10/05/2014 09:23     PHYSICAL EXAM Frail elderly Caucasian lady not in distress. . Afebrile. Head is nontraumatic. Neck is supple without bruit.  . Cardiac exam no murmur or gallop. Lungs are clear to auscultation. Distal pulses are well felt.  Neurological Exam : Awake alert oriented x 2.Diminished attention, registration and recall.Follows simple 1 step commands only. normal speech and language. Mild left lower face asymmetry. Tongue midline. Mild LUE drift. Mild diminished fine finger movements on left. Orbits right over left upper extremity. Mild left grip weak.. Normal sensation . Normal coordination. ASSESSMENT/PLAN Ms. Paula HELIA HAESE is a 80 y.o. female with history of hypothyroidism presenting with left facial droop and left sided weakness. She did not receive IV t-PA due to delay in arrival.   Stroke:  Non-dominant right basal ganglia infarct secondary to small vessel disease    MRI  R BG infarct  MRA  No large vessel stenosis  Carotid Doppler  No significant stenosis   2D Echo  No source of embolus   HgbA1c 6.3  Lovenox 20 mg sq daily for VTE prophylaxis  Dysphagia   Bedrest  no anticoagulants prior to admission, now on aspirin 150 mg suppository  Ongoing aggressive risk factor management  Therapy recommendations:  CIR vs SNF. Will get CIR consult  Disposition:  pending   Hypertension  Permissive hypertension (OK if < 220/120) but gradually normalize in 5-7 days  Hyperlipidemia  LDL 144 goal  < 70  Now on lipitor 40 mg daily  Continue statin at discharge  Other Stroke Risk Factors Advanced age ETOH use   Cachectic Body mass index is 15.45 kg/(m^2).    Family hx stroke (father)  Hospital day # 2  Burnetta Sabin, MSN, RN, ANVP-BC, ANP-BC, [facility] Pager: [phone] 10/06/2014 2:49 PM   I have personally examined this patient, reviewed notes, independently viewed imaging studies, participated in medical decision making and plan of care. I have made any additions or clarifications directly to the above note. Agree with note above.D/W husband and answered questions. Stroke Team will sign off. Call for questions  Antony Contras, MD Medical Director [facility] Pager: [phone] 10/06/2014 2:49 PM   To contact Stroke Continuity provider, please refer to [email or web address] After hours, contact General Neurology

## 2014-10-06 NOTE — Progress Notes (Signed)
PROGRESS NOTE  Alexis Smith KNL:[id number] DOB: 03-15-1916 DOA: 09/24/2014 PCP: Walker Kehr, MD  HPI: Alexis Smith is a 80 y.o. female with prior h/o hypothyroidism, was brought in for wrsening left sided weakness and left facial droop.  Subjective/ 24 H Interval events - a bit more dyspneic this morning after breakfast, concern for aspiration, sats mid 90s on room air  Assessment/Plan: Acute thromboembolic cerebrovascular accident  CVA (cerebral infarction) - MRI positive - LDL 144, HLD 79, cholesterol 248. Start Statin - 2D echo pending, carotids pending - PT consult, SLP consult - continue Aspirin po - neuro following - re-assess swallowing today given concern for aspiration, MBS pending Hypothyroidism: Resume synthroid.  Hypokalemia replete as needed.   Diet: dysphagia 1 Fluids: none  DVT Prophylaxis: Lovenox  Code Status: Full Family Communication: d/w husband bedside  Disposition Plan: inpatient  Consultants:  Neurology   Procedures:  2D echo Study Conclusions - Left ventricle: The cavity size was normal. Wall thickness was normal. The estimated ejection fraction was 65%. Wall motion was normal; there were no regional wall motion abnormalities. - Pulmonary arteries: PA peak pressure: 42 mm Hg (S). - Impressions: No cardiac source of embolism was identified, but cannot be ruled out on the basis of this examination.   Antibiotics None   Studies  Filed Vitals:   10/06/14 0513 10/06/14 0902 10/06/14 0903 10/06/14 0947  BP: 209/84 164/93 164/93 118/62  Pulse: 108 139  141  Temp: 98.4 F (36.9 C) 98.4 F (36.9 C)  97.5 F (36.4 C)  TempSrc: Oral Oral  Oral  Resp: 18 20  18   Height:      Weight:      SpO2: 91% 94%  91%    Intake/Output Summary (Last 24 hours) at 10/06/14 1215 Last data filed at 10/06/14 7902  Gross per 24 hour  Intake    260 ml  Output   1650 ml  Net  -1390 ml   Filed Weights   10/11/2014 1445  Weight: 38.329 kg (84 lb 8 oz)    Exam:  General:  NAD  Cardiovascular: RRR  Respiratory: CTA biL  Abdomen: soft, non tender  MSK: no edema  Neuro: left facial droop, left sided weakness, dysarthric   Data Reviewed: Basic Metabolic Panel:  Recent Labs Lab 10/11/2014 1144 10/14/2014 1148 10/06/14 0535  NA 140 138 139  K 3.7 3.5* 3.1*  CL 100 101 98  CO2 27  --  24  GLUCOSE 95 95 145*  BUN 21 21 13   CREATININE 0.91 1.00 0.59  CALCIUM 9.1  --  10.2   Liver Function Tests:  Recent Labs Lab 09/24/2014 1144  AST 15  ALT 6  ALKPHOS 136*  BILITOT <0.2*  PROT 7.0  ALBUMIN 3.5   CBC:  Recent Labs Lab 10/13/2014 1144 09/20/2014 1148 10/06/14 0535  WBC 8.8  --  14.7*  NEUTROABS 7.4  --   --   HGB 11.5* 12.9 12.6  HCT 35.3* 38.0 38.1  MCV 89.1  --  87.0  PLT 262  --  308   CBG:  Recent Labs Lab 09/30/2014 1211  GLUCAP 107*   Studies: Ct Head Wo Contrast 09/20/2014 Chronic changes without acute abnormality.  These results were called by telephone at the time of interpretation on 09/21/2014 at 12:11 pm to Dr. Armida Sans , who verbally acknowledged these results.  MRI brain 10/21 2.5 cm acute infarction affecting the right basal ganglia and corona radiata region. No mass effect or  hemorrhage.  Extensive chronic small vessel ischemic changes elsewhere throughout the brain. No large or medium vessel occlusion or correctable proximal stenosis. Distal vessel atherosclerotic irregularity.   Scheduled Meds: .  stroke: mapping our early stages of recovery book   Does not apply Once  . amLODipine  5 mg Oral Daily  . aspirin  81 mg Oral Daily  . atorvastatin  40 mg Oral q1800  . enoxaparin (LOVENOX) injection  20 mg Subcutaneous Q24H  . [START ON Oct 20, 2014] levothyroxine  50 mcg Oral QAC breakfast   Continuous Infusions: . sodium chloride 10 mL/hr (10/05/14 1750)    Active Problems:   Acute thromboembolic cerebrovascular accident   CVA (cerebral infarction)   Time spent: [place],  MD [facility] Pager [phone]. If 7 PM - 7 AM, please contact night-coverage at [email or web address], password [facility] 10/06/2014, 12:15 PM  LOS: 2 days

## 2014-10-07 ENCOUNTER — Inpatient Hospital Stay (HOSPITAL_COMMUNITY): Payer: Medicare PPO

## 2014-10-07 DIAGNOSIS — E785 Hyperlipidemia, unspecified: Secondary | ICD-10-CM

## 2014-10-07 DIAGNOSIS — E46 Unspecified protein-calorie malnutrition: Secondary | ICD-10-CM

## 2014-10-07 DIAGNOSIS — J690 Pneumonitis due to inhalation of food and vomit: Secondary | ICD-10-CM

## 2014-10-07 DIAGNOSIS — E039 Hypothyroidism, unspecified: Secondary | ICD-10-CM

## 2014-10-07 DIAGNOSIS — J9601 Acute respiratory failure with hypoxia: Secondary | ICD-10-CM

## 2014-10-07 LAB — BASIC METABOLIC PANEL
Anion gap: 17 — ABNORMAL HIGH (ref 5–15)
BUN: 32 mg/dL — ABNORMAL HIGH (ref 6–23)
CO2: 22 meq/L (ref 19–32)
Calcium: 10.3 mg/dL (ref 8.4–10.5)
Chloride: 104 mEq/L (ref 96–112)
Creatinine, Ser: 0.97 mg/dL (ref 0.50–1.10)
GFR calc Af Amer: 55 mL/min — ABNORMAL LOW (ref >90)
GFR calc non Af Amer: 48 mL/min — ABNORMAL LOW (ref >90)
Glucose, Bld: 137 mg/dL — ABNORMAL HIGH (ref 70–99)
POTASSIUM: 3.3 meq/L — AB (ref 3.7–5.3)
SODIUM: 143 meq/L (ref 137–147)

## 2014-10-07 LAB — CBC
HCT: 37 % (ref 36.0–46.0)
HEMOGLOBIN: 12.3 g/dL (ref 12.0–15.0)
MCH: 29.7 pg (ref 26.0–34.0)
MCHC: 33.2 g/dL (ref 30.0–36.0)
MCV: 89.4 fL (ref 78.0–100.0)
PLATELETS: 282 10*3/uL (ref 150–400)
RBC: 4.14 MIL/uL (ref 3.87–5.11)
RDW: 14.2 % (ref 11.5–15.5)
WBC: 9.7 10*3/uL (ref 4.0–10.5)

## 2014-10-07 MED ORDER — AMOXICILLIN-POT CLAVULANATE 875-125 MG PO TABS: 1.0000 | ORAL_TABLET | Freq: Two times a day (BID) | ORAL | Status: DC

## 2014-10-07 MED ORDER — AMOXICILLIN-POT CLAVULANATE 500-125 MG PO TABS
1.0000 | ORAL_TABLET | Freq: Two times a day (BID) | ORAL | Status: DC
  Filled 2014-10-07 (×2): qty 1

## 2014-10-07 MED ORDER — PIPERACILLIN-TAZOBACTAM IN DEX 2-0.25 GM/50ML IV SOLN
2.2500 g | Freq: Three times a day (TID) | INTRAVENOUS | Status: DC
  Administered 2014-10-07: 2.25 g via INTRAVENOUS
  Filled 2014-10-07 (×3): qty 50

## 2014-10-07 MED ORDER — FUROSEMIDE 10 MG/ML IJ SOLN
40.0000 mg | Freq: Once | INTRAMUSCULAR | Status: AC
  Administered 2014-10-07: 40 mg via INTRAVENOUS

## 2014-10-07 MED ORDER — MORPHINE SULFATE 2 MG/ML IJ SOLN
0.5000 mg | INTRAMUSCULAR | Status: DC | PRN
  Administered 2014-10-07 (×2): 0.5 mg via INTRAVENOUS
  Filled 2014-10-07: qty 1

## 2014-10-07 MED ORDER — FUROSEMIDE 10 MG/ML IJ SOLN
INTRAMUSCULAR | Status: AC
  Administered 2014-10-07: 40 mg via INTRAVENOUS
  Filled 2014-10-07: qty 4

## 2014-10-07 MED ORDER — MORPHINE SULFATE 2 MG/ML IJ SOLN
INTRAMUSCULAR | Status: AC
  Administered 2014-10-07: 0.5 mg via INTRAVENOUS
  Filled 2014-10-07: qty 1

## 2014-10-11 ENCOUNTER — Ambulatory Visit: Payer: Medicare PPO | Admitting: Internal Medicine

## 2014-10-16 NOTE — Progress Notes (Signed)
ANTIBIOTIC CONSULT NOTE - INITIAL  Pharmacy Consult for zosyn Indication: pneumonia  Allergies  Allergen Reactions  . Codeine Phosphate     REACTION: unspecified  . Tetanus Toxoid Adsorbed     REACTION: unspecified    Patient Measurements: Height: 5\' 2"  (157.5 cm) Weight: 84 lb 8 oz (38.329 kg) IBW/kg (Calculated) : 50.1 Adjusted Body Weight:   Vital Signs: Temp: 98.7 F (37.1 C) (10/23 0500) Temp Source: Oral (10/23 0500) BP: 116/77 mmHg (10/23 1054) Pulse Rate: 147 (10/23 1054) Intake/Output from previous day: 10/22 0701 - 10/23 0700 In: 200 [P.O.:200] Out: -  Intake/Output from this shift: Total I/O In: 120 [P.O.:120] Out: -   Labs:  Recent Labs  09/19/2014 1144 10/01/2014 1148 10/06/14 0535 10/21/14 0513  WBC 8.8  --  14.7* 9.7  HGB 11.5* 12.9 12.6 12.3  PLT 262  --  308 282  CREATININE 0.91 1.00 0.59 0.97   Estimated Creatinine Clearance: 20.5 ml/min (by C-G formula based on Cr of 0.97). No results found for this basename: VANCOTROUGH, VANCOPEAK, VANCORANDOM, GENTTROUGH, GENTPEAK, GENTRANDOM, TOBRATROUGH, TOBRAPEAK, TOBRARND, AMIKACINPEAK, AMIKACINTROU, AMIKACIN,  in the last 72 hours   Microbiology: No results found for this or any previous visit (from the past 720 hour(s)).  Medical History: Past Medical History  Diagnosis Date  . Blood in stool   . FHx: allergies   . Thyroid condition   . Diverticulitis   . Anemia   . Anxiety   . Depression   . History of orthostatic hypotension   . Restless leg syndrome   . Insomnia   . Osteopenia   . GERD (gastroesophageal reflux disease)   . Hemorrhoidal skin tag   . Hyperlipidemia   . Hypothyroidism   . Diverticulosis   . Hemorrhoids   . Chronic urethritis   . Weight loss   . Peripheral neuropathy   . Cervical cancer 1960  . Arthritis     hands   . Dysphagia   . Hiatal hernia     Medications:  Scheduled:  .  stroke: mapping our early stages of recovery book   Does not apply Once  .  amLODipine  5 mg Oral Daily  . aspirin  81 mg Oral Daily  . atorvastatin  40 mg Oral q1800  . enoxaparin (LOVENOX) injection  20 mg Subcutaneous Q24H  . levothyroxine  50 mcg Oral QAC breakfast   Infusions:  . sodium chloride 10 mL/hr (10/05/14 1750)   Assessment: 80 yo who was admitted for CVA. Zosyn has now been ordered to cover for PNA. She has been afebrile. CXR showed questionable infiltrate.   Plan:   [place], PharmD Pager: [phone] 10/21/14 11:05 AM

## 2014-10-16 NOTE — Progress Notes (Signed)
Chaplain responded to page that pt's husband Pilar Plate had requested chaplain presence. Pilar Plate grateful that chaplain came. Pilar Plate shared that his daughter died a week ago and that his wife's sudden health decline has been difficult for him. Pt present while nurses monitored pt and updated Pilar Plate on pt's health status. Chaplain prayed with Pilar Plate for pt and for their daughter. Encouraged Pilar Plate to ask for chaplain if needed. Will follow as needed and inform the unit chaplain.   2014/10/18 1108  Clinical Encounter Type  Visited With Patient and family together;Health care provider  Visit Type Spiritual support  Referral From Patient  Consult/Referral To Chaplain  Spiritual Encounters  Spiritual Needs Emotional;Grief support;Prayer  Stress Factors  Family Stress Factors Loss;Health changes   Manuela Halbur, [place], Chaplain 18-Oct-2014] 11:11 AM

## 2014-10-16 NOTE — Progress Notes (Signed)
Pt observed in bed resting quietly. No noted labored breathing. Her husband at bedside. No noted distress at this time. HOB elevated on O2 @ 6L via n/c. O2 sat 89-90%.  Will continue to monitor.

## 2014-10-16 NOTE — Progress Notes (Signed)
Rehab admissions - I am following pt's case and noted that rehab MD recommended that skilled nursing be pursued. In addition, PT also recommends SNF in light of pt's decreased activity tolerance.   I spoke with pt to explain this recommendation for SNF and pt mumbled throughout in response to my comments. I then called and left a message with pt's husband. I explained that social work would follow up with pt/family to plan for discharge.  I spoke with Hassan Rowan, case Freight forwarder and Marjorie Smolder, social worker about our recommendation for SNF. Pt's RN also aware.  I will now sign off pt's case. Please call me with any questions. Thanks.  Nanetta Batty, PT Rehabilitation Admissions Coordinator [phone]

## 2014-10-16 NOTE — Progress Notes (Signed)
Chaplain followed up with pt and family. Upon arrival chaplain informed that pt passed. Chaplain offered hospitality, offered prayer, and facilitated storytelling. Husband seems to be focused on organizing tasks at this point. Pt husband thanked Clinical biochemist for her services.   06-Nov-2014 1400  Clinical Encounter Type  Visited With Patient;Health care provider  Visit Type Spiritual support;Death  Referral From [place] Needs Emotional;Grief support;Prayer  Stress Factors  Family Stress Factors Loss of control;Major life changes  Maryam Feely, Epifanio Lesches 11/06/2014 2:31 PM

## 2014-10-16 NOTE — Progress Notes (Signed)
Responded to pt room family stated  that pt was uncomfortable. Observed labored breathing. HOB elevated on 6L of O2 via n/c pt refused to wear mask rebreather. Respiratory provided NT suctioning with some relief. O2 sat 88% HR 143 RR 32.  Md notified instructed to administer another dose of Morphine.  After receiving Morphine pt with some relief. Family at bedside pt will be transferred to 2C11. Will continue to monitor.

## 2014-10-16 NOTE — Discharge Summary (Signed)
Physician Death Summary  PRESSLEY BARSKY CNO:[id number] DOB: 02-Sep-1916 DOA: October 28, 2014  PCP: Walker Kehr, MD  Admit date: 10-28-14 Death date: Oct 31, 2014  Time spent: 35 minutes  Discharge Diagnoses:  Active Problems:   Acute thromboembolic cerebrovascular accident   CVA (cerebral infarction)   Aspiration pneumonia   Acute respiratory failure with hypoxia   Hyperlipidemia   Hypothyroidism   Protein-calorie malnutrition  Filed Weights   28-Oct-2014 1445 10-31-2014 1330  Weight: 38.329 kg (84 lb 8 oz) 38.329 kg (84 lb 8 oz)   History of present illness:  Alexis Smith is a 80 y.o. female with prior h/o hypothyroidism, was brought in for wrsening left sided weakness and left facial droop occurred yesterday. Today she was found on the floor and was brought to the hospital . She is referred to medical service for evaluation of CVA. Neurology consulted.  Hospital Course:  Patient was admitted with concerns for an acute CVA. She underwent an MRI which confirmed acute ischemic stroke of 2.5 cm acute infarction affecting the right basal ganglia and corona radiata region without mass effect or hemorrhage. Neurology was consulted and followed patient while hospitalized. SLP evaluated her initially and recommended Dysphagia 2 diet, however patient was progressively becoming weaker and on subsequent evaluation by speech therapy she underwent a modified barium swallow and her diet was modified to dysphagia 1 without liquids. On Nov 01, 2023 patient was found to be in respiratory distress and increasingly hypoxic, likely multifactorial due to a suspected aspiration event, poor cough and poor handling of her own secretions. I discussed with her husband that if her respiratory status continues to deteriorate may need intubation. Per their wishes patient would not want intubation and be on a ventilator support and thus code status was re-addressed and she was made DNR. IV antibiotics were started for presumed  aspiration, oxygen support per [place] (patient did not want to have continuous NRB mask) and patient was planned to be moved to SDU for close monitoring her respiratory status. She became more lethargic and with labored breathing towards afternoon and patient's family decided against moving her to step down as she appeared at peace, and to focus on keeping her comfortable. Patient expired later on 10/31/14.  Procedures:  None    Consultations:  Neurology   The results of significant diagnostics from this hospitalization (including imaging, microbiology, ancillary and laboratory) are listed below for reference.    Significant Diagnostic Studies: Dg Chest 2 View  10/05/2014   CLINICAL DATA:  Patient is week and short of breath.  Stroke.  EXAM: CHEST  2 VIEW  COMPARISON:  04/25/2014.  FINDINGS: Cardiac silhouette is normal in size and configuration. Aorta is mildly uncoiled. Moderate size hiatal hernia. No mediastinal or hilar masses.  Lungs are mildly hyperexpanded but clear. No pleural effusion or pneumothorax  Bony thorax is demineralized but intact.  IMPRESSION: No acute cardiopulmonary disease.   Electronically Signed   By: Lajean Manes M.D.   On: 10/05/2014 08:32   Ct Head Wo Contrast  2014/10/28   CLINICAL DATA:  Left-sided weakness and aphasia  EXAM: CT HEAD WITHOUT CONTRAST  TECHNIQUE: Contiguous axial images were obtained from the base of the skull through the vertex without intravenous contrast.  COMPARISON:  06/10/2007  FINDINGS: The bony calvarium is intact. Diffuse atrophic changes are again identified as are chronic white matter ischemic changes. No findings to suggest acute hemorrhage, acute infarction or space-occupying mass lesion are identified.  IMPRESSION: Chronic changes without acute abnormality.  These results were called by telephone at the time of interpretation on 09/17/2014 at 12:11 pm to Dr. Armida Sans , who verbally acknowledged these results.   Electronically Signed   By:  Inez Catalina M.D.   On: 10/06/2014 12:11   Mr Brain Wo Contrast  10/05/2014   CLINICAL DATA:  Worsening of left-sided weakness. Left facial droop. Onset yesterday. Found on the floor.  EXAM: MRI HEAD WITHOUT CONTRAST  MRA HEAD WITHOUT CONTRAST  TECHNIQUE: Multiplanar, multiecho pulse sequences of the brain and surrounding structures were obtained without intravenous contrast. Angiographic images of the head were obtained using MRA technique without contrast.  COMPARISON:  Head CT 09/22/2014.  MRI 07/19/2007.  FINDINGS: MRI HEAD FINDINGS  There is a 2.5 cm region of acute infarction affecting the right basal ganglia and corona radiata region. No swelling, hemorrhage or mass effect. No other acute infarction. Chronic small-vessel ischemic changes affect the pons. No focal cerebellar insult. There chronic small-vessel ischemic changes affecting the thalami, basal ganglia and throughout the cerebral hemispheric white matter. No cortical or large vessel territory infarction. No mass lesion, hemorrhage, hydrocephalus or extra-axial collection. No pituitary mass. No inflammatory sinus disease. No skull or skullbase lesion.  MRA HEAD FINDINGS  Both internal carotid arteries are widely patent into the brain. No siphon stenosis. The anterior and middle cerebral vessels are patent bilaterally without proximal stenosis, aneurysm or vascular malformation.  Both vertebral arteries are widely patent to the basilar. No basilar stenosis. Posterior circulation branch vessels are patent an unremarkable. Large posterior communicating artery noted on the right.  Distal vessels show some atherosclerotic irregularity.  IMPRESSION: 2.5 cm acute infarction affecting the right basal ganglia and corona radiata region. No mass effect or hemorrhage.  Extensive chronic small vessel ischemic changes elsewhere throughout the brain.  No large or medium vessel occlusion or correctable proximal stenosis. Distal vessel atherosclerotic  irregularity.   Electronically Signed   By: Nelson Chimes M.D.   On: 10/05/2014 09:23   Dg Chest Port 1 View  11-01-14   CLINICAL DATA:  Seizure and increased dyspnea.  EXAM: PORTABLE CHEST - 1 VIEW  COMPARISON:  10/06/2014 and CT 07/14/2012  FINDINGS: There are new basilar airspace densities, right side greater the left. Again noted is marked elevation of the stomach and suggestive for a diaphragmatic hernia. Heart size is grossly stable. Upper lungs are clear. There is a small amount of contrast in the stomach from previous swallowing examination.  IMPRESSION: Development of bibasilar airspace densities. Differential diagnosis includes pneumonia and aspiration.  Elevation of the left hemidiaphragm versus diaphragmatic hernia.   Electronically Signed   By: Markus Daft M.D.   On: November 01, 2014 11:55   Dg Chest Port 1 View  10/06/2014   CLINICAL DATA:  Crackling noises with breathing, aspiration of food, weakness, history cervical cancer  EXAM: PORTABLE CHEST - 1 VIEW  COMPARISON:  Portable exam 1756 hr compared to 10/05/2014  FINDINGS: Normal heart size and pulmonary vascularity.  Calcified mildly tortuous thoracic aorta.  Elevation of LEFT diaphragm.  Atelectasis versus infiltrate in LEFT lower lobe.  Contrast within stomach.  Question emphysematous changes.  No pneumothorax or pleural effusion.  Bones demineralized.  IMPRESSION: Elevation of LEFT diaphragm with question mild atelectasis versus infiltrate in LEFT lower lobe.   Electronically Signed   By: Lavonia Dana M.D.   On: 10/06/2014 18:28   Dg Swallowing Func-speech Pathology  10/06/2014   Eden Emms, [place]     10/06/2014  2:39 PM Objective Swallowing  Evaluation: Modified Barium Swallowing Study   Patient Details  Name: Alexis Smith MRN: [id number] Date of Birth: May 02, 1916  Today's Date: 10/06/2014 Time: 7209-4709 SLP Time Calculation (min): 31 min  Past Medical History:  Past Medical History  Diagnosis Date  . Blood in stool   . FHx:  allergies   . Thyroid condition   . Diverticulitis   . Anemia   . Anxiety   . Depression   . History of orthostatic hypotension   . Restless leg syndrome   . Insomnia   . Osteopenia   . GERD (gastroesophageal reflux disease)   . Hemorrhoidal skin tag   . Hyperlipidemia   . Hypothyroidism   . Diverticulosis   . Hemorrhoids   . Chronic urethritis   . Weight loss   . Peripheral neuropathy   . Cervical cancer 1960  . Arthritis     hands   . Dysphagia   . Hiatal hernia    Past Surgical History:  Past Surgical History  Procedure Laterality Date  . Abdominal hysterectomy  1960  . Hand surgery      bilateral  . Hemorrhoid surgery    . Cataract extraction      Bilaterally  . Ercp  04/16/2012    Procedure: ENDOSCOPIC RETROGRADE CHOLANGIOPANCREATOGRAPHY  (ERCP);  Surgeon: Inda Castle, MD;  Location: [facility];   Service: Gastroenterology;  Laterality: N/A;  . Ercp    . Colonoscopy    . Breast lumpectomy      left  . Egd with dilation  06/2012    Dr. Deatra Ina  . Esophagogastroduodenoscopy  07/28/2012    Procedure: ESOPHAGOGASTRODUODENOSCOPY (EGD);  Surgeon: Jerene Bears, MD;  Location: [facility];  Service: Gastroenterology;   Laterality: N/A;  balloon dilation  . Balloon dilation  07/28/2012    Procedure: BALLOON DILATION;  Surgeon: Jerene Bears, MD;   Location: [facility];  Service: Gastroenterology;  Laterality:  N/A;   HPI:  Alexis Smith is a 80 y.o. female with prior h/o  hypothyroidism,GERD, esophageal dysphagia s/p EGD with balloon  dilation x2, and HH. She was brought in 10/12/2014 for worsening  left sided weakness and left facial droop that began the day  before. CXR was clear and MRI revealed acute infarction affecting  the right basal ganglia and corona radiata region with extensive  chronic small vessel ischemic changes elsewhere throughout the  brain.     Assessment / Plan / Recommendation Clinical Impression  Dysphagia Diagnosis: Moderate pharyngeal phase dysphagia;Severe  oral phase dysphagia Clinical impression:  Pt demonstrates a severe oral stage and  moderate pharyngeal phase dysphagia. Her oral stage is  characterized by difficulty manipulating, forming, and transiting  the bolus likely due to buccal and labial weakenss leading to  severe oral residue. Piecemeal swallowing and oral residue  results in spillage to pharynx post swallow with no swallow  trigger despite maximal verbal cues. A dry spoon and throat touch  are inconsistently productive in eliciting a swallow. Thin  liquids are silently aspirated requiring verbal cueing for cough  to clear. Pt's cough is very weak and is not effective in  clearing aspirate.The pt is cognitively unable to follow commands  for compensatory strategies and shows little awarenes of her  deficits. Trials of solids were transited much more quickly and  oral residue was reduced. Recommend pt begin a puree only diet  with no liquids. SLP will follow up with trials of liquids as  appropriate. Pt's husband was  informed of results and  recommendations. Expect that return to liquid intake with known  aspiration risk may be necessary as pt is likely not appropriate  for alternate means of nutrition.    Treatment Recommendation  Therapy as outlined in treatment plan below    Diet Recommendation No liquids;Dysphagia 1 (Puree)   Medication Administration: Whole meds with puree Supervision: Staff to assist with self feeding Compensations: Slow rate;Small sips/bites;Multiple dry swallows  after each bite/sip Postural Changes and/or Swallow Maneuvers: Seated upright 90  degrees;Upright 30-60 min after meal    Other  Recommendations Oral Care Recommendations: Oral care BID   Follow Up Recommendations  Skilled Nursing facility;24 hour supervision/assistance    Frequency and Duration min 2x/week  2 weeks   Pertinent Vitals/Pain none    SLP Swallow Goals     General Date of Onset: 10/03/14 HPI: CAIYA BETTES is a 80 y.o. female with prior h/o  hypothyroidism,GERD, esophageal dysphagia s/p EGD with  balloon  dilation x2, and HH. She was brought in 09/23/2014 for worsening  left sided weakness and left facial droop that began the day  before. CXR was clear and MRI revealed acute infarction affecting  the right basal ganglia and corona radiata region with extensive  chronic small vessel ischemic changes elsewhere throughout the  brain. Type of Study: Modified Barium Swallowing Study Reason for Referral: Objectively evaluate swallowing function Previous Swallow Assessment: none in chart (see HPI for  description of esophageal dysphagia) Diet Prior to this Study: Dysphagia 2 (chopped);Thin liquids Temperature Spikes Noted: No Respiratory Status: Room air History of Recent Intubation: No Behavior/Cognition: Alert;Cooperative;Requires cueing;Doesn't  follow directions Oral Cavity - Dentition: Edentulous Oral Motor / Sensory Function: Impaired motor;Impaired sensory Oral impairment: Left facial;Left labial Self-Feeding Abilities: Needs assist Patient Positioning: Upright in chair Baseline Vocal Quality: Clear;Low vocal intensity;Other (comment) Volitional Cough: Weak Volitional Swallow: Unable to elicit Anatomy: Within functional limits    Reason for Referral Objectively evaluate swallowing function   Oral Phase Oral Preparation/Oral Phase Oral Phase: Impaired Oral - Nectar Oral - Nectar Teaspoon: Impaired mastication;Weak lingual  manipulation;Lingual pumping;Reduced posterior propulsion;Holding  of bolus;Piecemeal swallowing;Delayed oral transit Oral - Thin Oral - Thin Teaspoon: Impaired mastication;Weak lingual  manipulation;Lingual pumping;Reduced posterior propulsion;Holding  of bolus;Piecemeal swallowing;Delayed oral transit Oral - Solids Oral - Puree: Impaired mastication;Weak lingual  manipulation;Lingual pumping;Reduced posterior propulsion;Holding  of bolus;Piecemeal swallowing;Delayed oral transit   Pharyngeal Phase Pharyngeal Phase Pharyngeal Phase: Impaired Pharyngeal - Nectar Pharyngeal - Nectar Teaspoon:  Delayed swallow  initiation;Premature spillage to pyriform sinuses Pharyngeal - Thin Pharyngeal - Thin Teaspoon: Delayed swallow initiation;Premature  spillage to pyriform sinuses;Penetration/Aspiration during  swallow;Moderate aspiration Penetration/Aspiration details (thin teaspoon): Material enters  airway, passes BELOW cords without attempt by patient to eject  out (silent aspiration) Pharyngeal - Solids Pharyngeal - Puree: Delayed swallow initiation;Premature spillage  to pyriform sinuses  Cervical Esophageal Phase    GO    Cervical Esophageal Phase Cervical Esophageal Phase: Delfina Redwood 10/06/2014, 2:37 PM    Mr Jodene Nam Head/brain Wo Cm  10/05/2014   CLINICAL DATA:  Worsening of left-sided weakness. Left facial droop. Onset yesterday. Found on the floor.  EXAM: MRI HEAD WITHOUT CONTRAST  MRA HEAD WITHOUT CONTRAST  TECHNIQUE: Multiplanar, multiecho pulse sequences of the brain and surrounding structures were obtained without intravenous contrast. Angiographic images of the head were obtained using MRA technique without contrast.  COMPARISON:  Head CT 09/23/2014.  MRI 07/19/2007.  FINDINGS: MRI HEAD  FINDINGS  There is a 2.5 cm region of acute infarction affecting the right basal ganglia and corona radiata region. No swelling, hemorrhage or mass effect. No other acute infarction. Chronic small-vessel ischemic changes affect the pons. No focal cerebellar insult. There chronic small-vessel ischemic changes affecting the thalami, basal ganglia and throughout the cerebral hemispheric white matter. No cortical or large vessel territory infarction. No mass lesion, hemorrhage, hydrocephalus or extra-axial collection. No pituitary mass. No inflammatory sinus disease. No skull or skullbase lesion.  MRA HEAD FINDINGS  Both internal carotid arteries are widely patent into the brain. No siphon stenosis. The anterior and middle cerebral vessels are patent bilaterally without proximal stenosis, aneurysm or  vascular malformation.  Both vertebral arteries are widely patent to the basilar. No basilar stenosis. Posterior circulation branch vessels are patent an unremarkable. Large posterior communicating artery noted on the right.  Distal vessels show some atherosclerotic irregularity.  IMPRESSION: 2.5 cm acute infarction affecting the right basal ganglia and corona radiata region. No mass effect or hemorrhage.  Extensive chronic small vessel ischemic changes elsewhere throughout the brain.  No large or medium vessel occlusion or correctable proximal stenosis. Distal vessel atherosclerotic irregularity.   Electronically Signed   By: Nelson Chimes M.D.   On: 10/05/2014 09:23   Labs: Basic Metabolic Panel:  Recent Labs Lab 10/08/2014 1144 10/13/2014 1148 10/06/14 0535 10-13-2014 0513  NA 140 138 139 143  K 3.7 3.5* 3.1* 3.3*  CL 100 101 98 104  CO2 27  --  24 22  GLUCOSE 95 95 145* 137*  BUN 21 21 13  32*  CREATININE 0.91 1.00 0.59 0.97  CALCIUM 9.1  --  10.2 10.3   Liver Function Tests:  Recent Labs Lab 10/11/2014 1144  AST 15  ALT 6  ALKPHOS 136*  BILITOT <0.2*  PROT 7.0  ALBUMIN 3.5   CBC:  Recent Labs Lab 09/22/2014 1144 09/16/2014 1148 10/06/14 0535 10/13/2014 0513  WBC 8.8  --  14.7* 9.7  NEUTROABS 7.4  --   --   --   HGB 11.5* 12.9 12.6 12.3  HCT 35.3* 38.0 38.1 37.0  MCV 89.1  --  87.0 89.4  PLT 262  --  308 282   CBG:  Recent Labs Lab 09/30/2014 1211  GLUCAP 107*     Signed:  Razi Hickle  [facility] 13-Oct-2014, 6:11 PM

## 2014-10-16 NOTE — Progress Notes (Signed)
Responded to pts room observed pt in respiratory distress labored breathing.  Upon ausculation rhonchi instructed pt to cough but she was unable to clear with cough.  O2 sat 88% HR 147.  HOB elevated non-rebreather with O2 applied.  Rapid response, respiratory, and Md notified. Husband at bedside.  Pt received NT suctioning with some relief.  Administered Lasix 40 mg and Morphine IV to right forearm per Md order.  After administering Lasix and Morphine pt's lung sounds did improve pt alert breathing calm and unlabored O2 sat 92-100% on O2 via n/c HR still remained at 140's. Pt resting at this time with family at bedside. She denies pain. Will continue to provide comfort.

## 2014-10-16 NOTE — Progress Notes (Signed)
PT Cancellation Note  Patient Details Name: Alexis Smith MRN: [id number] DOB: January 17, 1916   Cancelled Treatment:    Reason Eval/Treat Not Completed: Medical issues which prohibited therapy.  Rapid Response in room.  PT will check back as time allows.  Thanks,    Barbarann Ehlers. Aleria Maheu, PT, DPT [phone]   2014/10/17, 10:20 AM

## 2014-10-16 NOTE — Progress Notes (Signed)
OT Cancellation Note  Patient Details Name: [name] MRN: [id number] DOB: [birth year]   Cancelled Treatment:    Reason Eval/Treat Not Completed: Medical issues which prohibited therapy , note pt. With respiratory issue earlier today that required intervention from rapid response team.  Pt. Stable now.  Will hold for today and check back as pt. Medically able to participate in skilled therapies.  Janice Coffin, COTA/L 2014-11-03, 12:25 PM

## 2014-10-16 DEATH — deceased

## 2014-10-28 DIAGNOSIS — I639 Cerebral infarction, unspecified: Secondary | ICD-10-CM | POA: Insufficient documentation

## 2015-08-19 IMAGING — CR DG CHEST 1V PORT
1 series · 1 of 1 positions shown · non-contrast
Comparison: 10/06/2014 and CT 07/14/2012

CLINICAL DATA: Seizure and increased dyspnea.

EXAM:
PORTABLE CHEST - 1 VIEW

[AP]
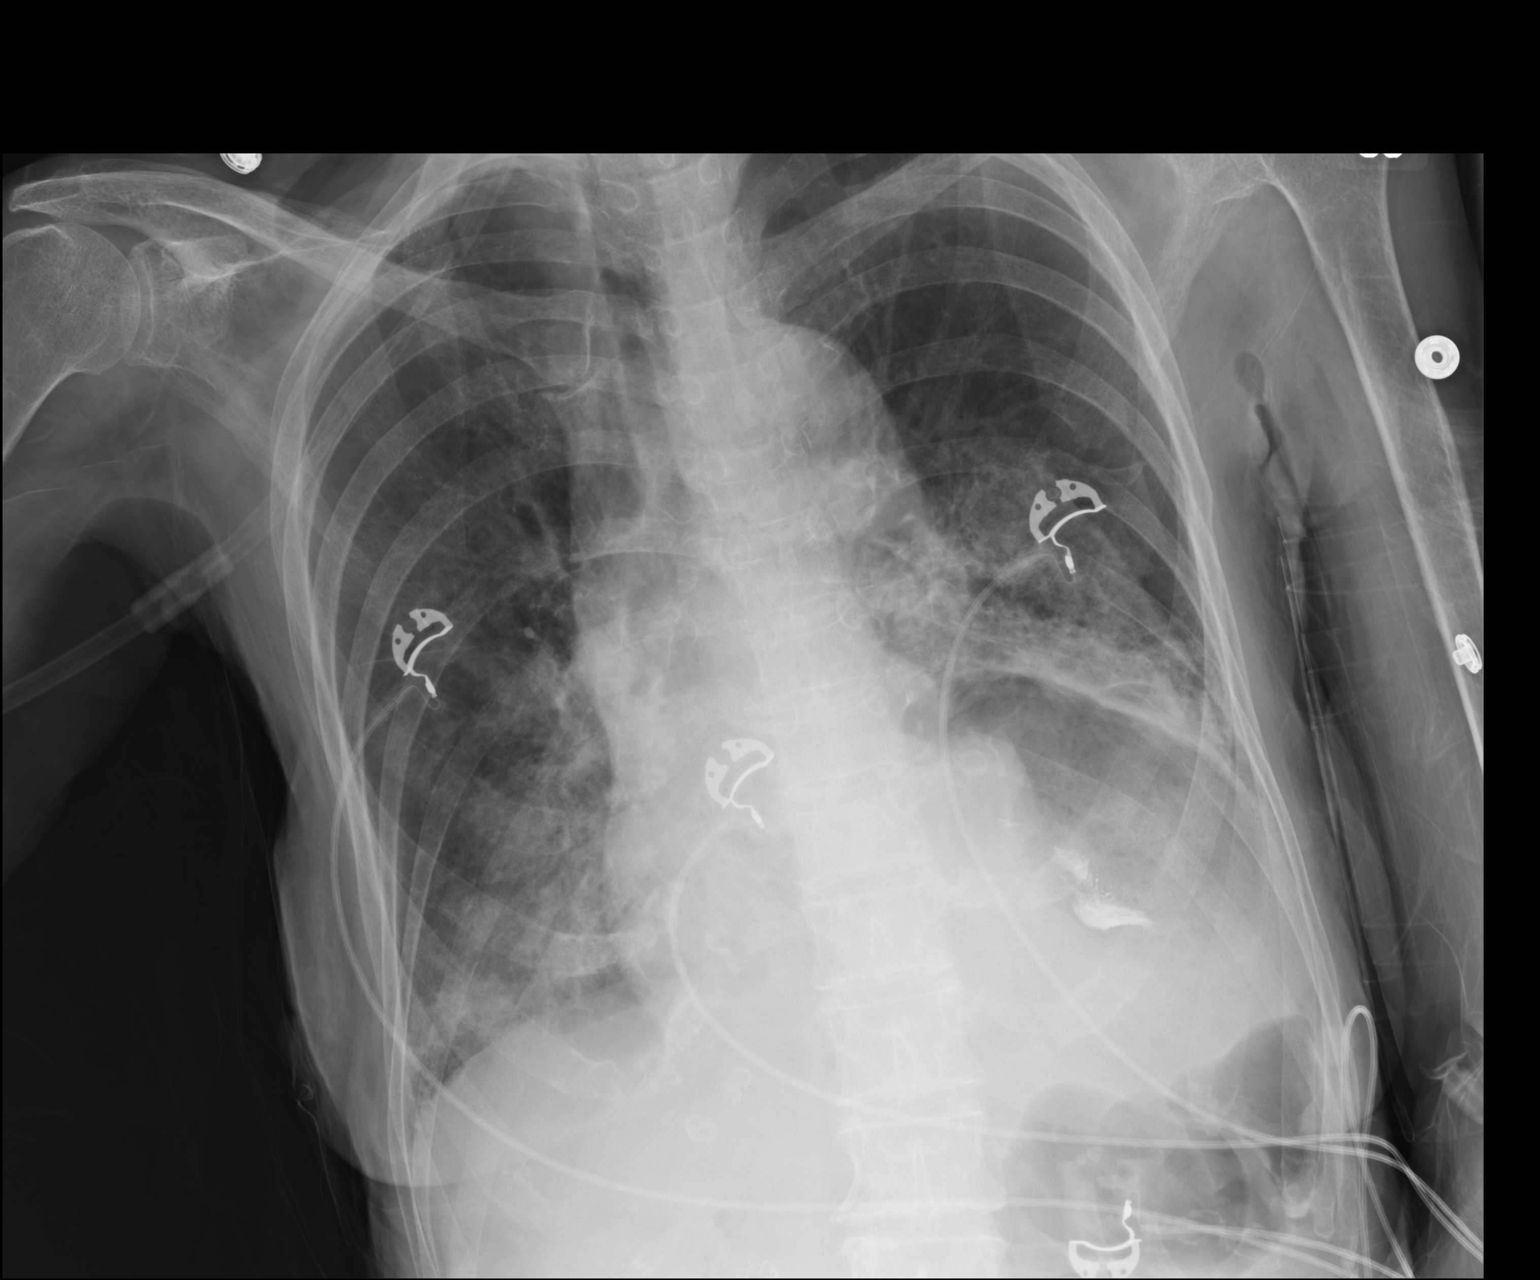

[1 of 1 positions shown; findings below may reference images not displayed]

FINDINGS: There are new basilar airspace densities, right side greater the
left. Again noted is marked elevation of the stomach and suggestive
for a diaphragmatic hernia. Heart size is grossly stable. Upper
lungs are clear. There is a small amount of contrast in the stomach
from previous swallowing examination.
IMPRESSION: Development of bibasilar airspace densities. Differential diagnosis
includes pneumonia and aspiration.

Elevation of the left hemidiaphragm versus diaphragmatic hernia.
